# Patient Record
Sex: Female | Born: 1960 | Hispanic: No | Marital: Married | Smoking: Never smoker
Health system: Southern US, Community
[De-identification: ages and names within clinical notes are randomized; demographics above are authoritative.]

## PROBLEM LIST (undated history)

## (undated) HISTORY — PX: AUGMENTATION MAMMAPLASTY: SUR837

## (undated) HISTORY — PX: NOSE SURGERY: SHX723

---

## 2000-08-08 ENCOUNTER — Encounter: Payer: Self-pay | Admitting: Emergency Medicine

## 2000-08-08 ENCOUNTER — Emergency Department (HOSPITAL_COMMUNITY): Admission: EM | Admit: 2000-08-08 | Discharge: 2000-08-08 | Payer: Self-pay | Admitting: Emergency Medicine

## 2001-05-20 ENCOUNTER — Other Ambulatory Visit: Admission: RE | Admit: 2001-05-20 | Discharge: 2001-05-20 | Payer: Self-pay | Admitting: Gynecology

## 2002-05-27 ENCOUNTER — Encounter: Payer: Self-pay | Admitting: Internal Medicine

## 2002-05-27 ENCOUNTER — Encounter: Admission: RE | Admit: 2002-05-27 | Discharge: 2002-05-27 | Payer: Self-pay | Admitting: Internal Medicine

## 2010-04-28 ENCOUNTER — Encounter: Payer: Self-pay | Admitting: Unknown Physician Specialty

## 2012-02-03 ENCOUNTER — Ambulatory Visit: Payer: Self-pay | Admitting: Gynecology

## 2013-06-30 ENCOUNTER — Ambulatory Visit: Payer: Self-pay | Admitting: Gynecology

## 2013-07-05 ENCOUNTER — Encounter: Payer: Self-pay | Admitting: Gynecology

## 2013-07-05 ENCOUNTER — Other Ambulatory Visit (HOSPITAL_COMMUNITY)
Admission: RE | Admit: 2013-07-05 | Discharge: 2013-07-05 | Disposition: A | Discharge status: Home / Self Care | Payer: Self-pay | Source: Ambulatory Visit | Attending: Gynecology | Admitting: Gynecology

## 2013-07-05 ENCOUNTER — Ambulatory Visit (INDEPENDENT_AMBULATORY_CARE_PROVIDER_SITE_OTHER): Payer: Self-pay | Admitting: Gynecology

## 2013-07-05 VITALS — BP 112/76 | Ht 61.0 in | Wt 110.0 lb

## 2013-07-05 DIAGNOSIS — N644 Mastodynia: Secondary | ICD-10-CM

## 2013-07-05 DIAGNOSIS — Z01419 Encounter for gynecological examination (general) (routine) without abnormal findings: Secondary | ICD-10-CM | POA: Insufficient documentation

## 2013-07-05 DIAGNOSIS — Z1151 Encounter for screening for human papillomavirus (HPV): Secondary | ICD-10-CM | POA: Insufficient documentation

## 2013-07-05 DIAGNOSIS — Z1159 Encounter for screening for other viral diseases: Secondary | ICD-10-CM

## 2013-07-05 DIAGNOSIS — M25559 Pain in unspecified hip: Secondary | ICD-10-CM

## 2013-07-05 LAB — CBC WITH DIFFERENTIAL/PLATELET
BASOS ABS: 0 10*3/uL (ref 0.0–0.1)
Basophils Relative: 0 % (ref 0–1)
Eosinophils Absolute: 0.1 10*3/uL (ref 0.0–0.7)
Eosinophils Relative: 1 % (ref 0–5)
HEMATOCRIT: 40.9 % (ref 36.0–46.0)
Hemoglobin: 13.9 g/dL (ref 12.0–15.0)
Lymphocytes Relative: 23 % (ref 12–46)
Lymphs Abs: 1.6 10*3/uL (ref 0.7–4.0)
MCH: 31.9 pg (ref 26.0–34.0)
MCHC: 34 g/dL (ref 30.0–36.0)
MCV: 93.8 fL (ref 78.0–100.0)
Monocytes Absolute: 0.6 10*3/uL (ref 0.1–1.0)
Monocytes Relative: 8 % (ref 3–12)
Neutro Abs: 4.8 10*3/uL (ref 1.7–7.7)
Neutrophils Relative %: 68 % (ref 43–77)
Platelets: 246 10*3/uL (ref 150–400)
RBC: 4.36 MIL/uL (ref 3.87–5.11)
RDW: 13 % (ref 11.5–15.5)
WBC: 7.1 10*3/uL (ref 4.0–10.5)

## 2013-07-05 LAB — COMPREHENSIVE METABOLIC PANEL
ALBUMIN: 4.5 g/dL (ref 3.5–5.2)
ALT: 20 U/L (ref 0–35)
AST: 24 U/L (ref 0–37)
Alkaline Phosphatase: 63 U/L (ref 39–117)
BUN: 20 mg/dL (ref 6–23)
CALCIUM: 9.6 mg/dL (ref 8.4–10.5)
CHLORIDE: 103 meq/L (ref 96–112)
CO2: 27 mEq/L (ref 19–32)
Creat: 0.57 mg/dL (ref 0.50–1.10)
Glucose, Bld: 84 mg/dL (ref 70–99)
Potassium: 4 mEq/L (ref 3.5–5.3)
SODIUM: 138 meq/L (ref 135–145)
TOTAL PROTEIN: 6.8 g/dL (ref 6.0–8.3)
Total Bilirubin: 0.5 mg/dL (ref 0.2–1.2)

## 2013-07-05 LAB — CHOLESTEROL, TOTAL: Cholesterol: 196 mg/dL (ref 0–200)

## 2013-07-05 LAB — TSH: TSH: 1.446 u[IU]/mL (ref 0.350–4.500)

## 2013-07-05 LAB — HEPATITIS C ANTIBODY: HCV Ab: NEGATIVE

## 2013-07-05 NOTE — Patient Instructions (Addendum)
Vacuna difteria, tétanos, tos ferina (DTP) - Lo que debe saber   (Tetanus, Diphtheria, Pertussis [Tdap] Vaccine, What You Need to Know)  ¿PORQUÉ VACUNARSE?   El tétanos, la difteria y la tos ferina pueden ser enfermedades muy graves, aún en adolescentes y adultos. La vacuna Tdap nos puede proteger de estas enfermedades.   El TÉTANOS (Trismo) provoca la contracción dolorosa de los músculos, por lo general, en todo el cuerpo.   · Puede causar el endurecimiento de los músculos de la cabeza y el cuello, de modo que impide abrir la boca, tragar y en algunos casos, respirar. El tétanos causa la muerte de 1 de cada 5 personas que se infectan.  La DIFTERIA produce la formación de una membrana gruesa que cubre el fondo de la garganta.   · Puede causar problemas respiratorios, parálisis, insuficiencia cardíaca e incluso la muerte.  TOS FERINA (Pertusis) causa episodios de tos graves, que pueden hacer difícil la respiración, causar vómitos y trastornos del sueño.   · También puede ser la causa de pérdida de peso, incontinencia y fractura de costillas. Dos de cada 100 adolescentes y cinco de cada 100 adultos que enferman de pertusis deben ser hospitalizados, tienen complicaciones como la neumonía o mueren.  Estas enfermedades son provocadas por bacterias. La difteria y el pertusis se contagian de persona a persona a través de la tos o el estornudo. El tétanos ingresa al organismo a través de cortes, rasguños o heridas.   Antes de las vacunas, en los [place] se vieron más de 200.000 casos al año de difteria y tos ferina y cientos de casos de tétanos. Desde el inicio de la vacunación, los casos de tétanos y difteria han disminuido alrededor del 99% y los casos de tos ferina alrededor del 80%.   Tdap   La vacuna Tdap protege a adolescentes y adultos contra el tétanos, la difteria y la tos ferina. Una dosis de Tdap se administra a los 11 o 12 años de edad. Las personas que no recibieron la vacuna Tdap a esa edad deben  recibirla tan pronto como sea posible.   Es muy importante que los profesionales de la salud y todos aquellos que tengan contacto cercano con bebés menores de 12 meses reciban la Tdap.   Las mujeres embarazadas deben recibir una dosis de Tdap en cada embarazo, para proteger al recién nacido de la tos ferina. Los niños tienen mayor riesgo de complicaciones graves y potencialmente mortales debido a la tos ferina.   Una vacuna similar, llamada Td, protege contra el tétanos y la difteria, pero no contra la tos ferina. Cada 10 años debe recibirse un refuerzo de Td. La Tdap se puede administrar como uno de estos refuerzos, si todavía no ha recibido una dosis. También se puede aplicar después de un corte o quemadura grave para prevenir la infección por tétanos.   El médico le dará más información.   La Tdap puede administrarse de manera segura simultáneamente con otras vacunas.   ALGUNAS PERSONAS NO DEBEN RECIBIR ESTA VACUNA.   · Si alguna vez tuvo una reacción alérgica potencialmente mortal después de una dosis de la vacuna contra el tétanos, la diferia o la tos ferina, o tuvo una alergia grave a cualquiera de los componentes de esta vacuna, no debe aplicarse la vacuna. Informe a su médico si usted sufre algún tipo de alergia grave.  · Si estuvo en coma o sufrió múltiples convulsiones dentro de los 7 días posteriores después de una dosis de DTP o DTaP   su mdico si:  tiene epilepsia u otra enfermedad del sistema nervioso,  siente dolor intenso o se hincha despus de recibir cualquier vacuna contra la difteria, el ttanos o la tos ferina,  alguna vez ha sufrido el sndrome de Guillain-Barr,  no se siente bien el da en que se ha programado la vacuna. RIESGOS DE UNA REACCIN A LA VACUNA Con cualquier medicamento, incluyendo las vacunas, existe la posibilidad de que aparezcan efectos secundarios. Estos son  leves y desaparecen por s solos, pero tambin son posibles las reacciones graves.  Breves episodios de desmayo pueden seguir a una vacunacin, causando lesiones por la cada. Sentarse o recostarse durante 15 minutos puede ayudar a evitarlo. Informe al mdico si se siente mareado o aturdido, tiene cambios en la visin o zumbidos en los odos.  Problemas leves luego de la Tdap (no interferirn con las actividades)   Dolor en el sitio de la inyeccin (alrededor de 1 de cada 4 adolescentes o 2 de cada 3 adultos).  Enrojecimiento o hinchazn en el lugar de la inyeccin (1 de cada 5 personas).  Fiebre leve de al menos 100,4 F (38 C) (hasta alrededor de 1 cada 25 adolescentes y 1 de cada 100 adultos).  Dolor de cabeza (3 o 4de cada 10 personas).  Cansancio (1 de cada 3 o 4 personas).  Nuseas, vmitos, diarrea, dolor de estmago (1 de cada 4 adolescentes o 1 de cada 10 adultos).  Escalofros, dolores corporales, dolor articular, erupciones, inflamacin de las glndulas (poco frecuente). Problemas moderados: (interfieren con las actividades, pero no requieren atencin mdica)   Dolor en el lugar de la inyeccin (1 de cada 5 adolescentes o 1 de cada 100 adultos).  Enrojecimiento o inflamacin (1 de cada 16 adolescentes y 1 de cada 25 adultos).  Fiebre de ms de 102F o 38,9C (1 de cada 100 adolescentes o 1 de cada 250 adultos).  Dolor de cabeza (alrededor de 4 de cada 20 adolescentes y 3 de cada 10 adultos).  Nuseas, vmitos, diarrea, dolor de estmago (1 a 3 de cada 100 personas).  Hinchazn de todo el brazo en el que se aplic la vacuna (3 de cada100 personas). Problemas graves: luego de la Tdap (no puede realizar las actividades habituales, requiere atencin mdica)   Inflamacin, dolor intenso, sangrado y enrojecimiento en el brazo, en el sitio de la inyeccin (poco frecuente). Una reaccin alrgica grave puede ocurrir despus de la administracin de cualquier vacuna (se estima en  menos de 1 en un milln de dosis).  QU PASA SI HAY UNA REACCIN GRAVE?  Qu signos debo buscar?  Observe todo lo que le preocupe, como signos de una reaccin alrgica grave, fiebre muy alta o cambios en el comportamiento. Los signos de una reaccin alrgica grave pueden incluir urticaria, hinchazn de la cara y la garganta, dificultad para respirar, ritmo cardaco acelerado, mareos y debilidad. Estos sntomas pueden comenzar entre unos pocos minutos y algunas horas despus de la vacunacin.  Qu debo hacer?  Si usted piensa que se trata de una reaccin alrgica grave o de otra emergencia que no puede esperar, llame al 911 o lleve a la persona al hospital ms cercano. De lo contrario, llame a su mdico.  Despus, la reaccin debe informarse a la "Vaccine Adverse Event Reporting System" (Sistema de informacin sobre efectos adversos de las vacunas -VAERS). El mdico o usted mismo pueden realizar el informe en el sitio web del VAERS www.vaers.hhs.govo llame al 1-800-822-7967. El VAERS es slo para informar reacciones. No   brindan consejo mdico.  PROGRAMA NACIONAL DE COMPENSACIN DE DAOS POR VACUNAS  El National Vaccine Injury [employer] (VICP) es un programa federal que fue creado para compensar a las personas que puedan haber sufrido daos al recibir ciertas vacunas.  Aquellas personas que consideren que han sufrido un dao como consecuencia de una vacuna y quieren saber ms acerca del programa y como presentar Roslynn Amble, pueden llamar [phone] o visite el sitio web del VICP en SpiritualWord.[email or web address].  CMO PUEDO OBTENER MS INFORMACIN?   Consulte a su mdico.  Comunquese con el servicio de salud de su localidad o [address].  Comunquese con los Centros para el control y la prevencin de Child psychotherapist for Disease Control and Prevention , CDC).  llamando al [phone] o visitando el sitio web del CDC en PicCapture.uy. CDC Tdap Vaccine VIS  (08/14/11)  Document Released: 03/10/2012 [facility] Patient Information 2014 [place], [place]. Densitometra sea  (Bone Densitometry) La densitometra sea es una radiografa especial que mide la densidad de los huesos y se [place] para predecir el riesgo de fracturas seas. Esta estudio se [place] para Warehouse manager contenido mineral y la densidad de los huesos para diagnosticar osteoporosis. La osteoporosis es la prdida de tejido seo que hace que el hueso se debilite. Generalmente ocurre en las mujeres que entran en la menopausia. Pero tambin pueden sufrirla los hombres y personas con otras enfermedades.  PREPARACIN PARA LA PRUEBA  No es necesaria la preparacin.  Geronimo Running EXAMINARSE?  Todas las [employer] de 65 aos. Las mujeres posmenopusicas (50 a 65 aos) con factores de riesgo para osteoporosis. Las personas que han sufrido fracturas previas realizando actividades normales. Las personas de contextura corporal delgada (menos de 127 libras [63.5 kg] o con un ndice de masa corporal [IMC] de menos de 21). Las personas que tengan un padre que haya sufrido una fractura de cadera o que tengan antecedentes de osteoporosis. Los fumadores. Las personas que sufren artritis reumatoidea. Los que consumen alcohol en exceso (ms de 3 medidas la mayor parte de [place]). Las mujeres con menopausia temprana. CUNDO DEBE REALIZAR UN NUEVO ESTUDIO?  Las guas actuales sugieren que se debe esperar por lo menos 2 aos antes de repetir una prueba de densidad sea, si la primera fue normal. Algunos estudios recientes indican que las mujeres con densidad sea normal pueden esperar unos aos antes de repetir un estudio de densitometra sea. Comente estos temas con su mdico.  HALLAZGOS NORMALES:  Normal: menos de una desviacin estndar por debajo de lo normal (superior a -1). Osteopenia:  1 a 2,5 desviaciones estndar por debajo de lo normal (-1 a -2,5). Osteoporosis: ms de 2,5 desviaciones  estndar por debajo de lo normal (menos de -2,5). Los [employer] se informan como una "puntuacin T" y Neomia Dear "puntuacin Z". La puntuacin T es el nmero que compara la densidad sea con la densidad sea de las mujeres jvenes y sanas. La puntuacin Z es un nmero que compara la densidad sea con las puntuaciones de mujeres de la misma [place], gnero y Armed forces training and education officer.  Los rangos para los resultados normales pueden variar entre diferentes laboratorios y hospitales. Consulte siempre con su mdico despus de Production assistant, radio estudio para Metallurgist significado de los [place] y si los valores se consideran "dentro de los lmites normales".  SIGNIFICADO DEL ESTUDIO  El mdico leer los resultados y Agricultural engineer con usted la importancia y el significado de los [place], as como las opciones de tratamiento y la necesidad de pruebas adicionales, si fuera  necesario.  OBTENCIN DE LOS RESULTADOS DE LAS PRUEBAS  Es su responsabilidad retirar el resultado del [place]udio. Consulte en el laboratorio cuando y cmo podr Starbucks Corporationobtener los resultados.  Document Released: 12/17/2011 [facility] Patient Information 2014 [place], [place].

## 2013-07-05 NOTE — Progress Notes (Signed)
Melanie Collins 12-07-1960 [id number]   History:    53 y.o.  for annual gyn exam who is a new patient to the practice. Patient for several months has complained of slight tenderness on the lateral aspect of the right breast but has not palpate any masses per se. She denies any family history of breast cancer. She had a [place] in 2014 [place] which she states was normal. Patient does have history of having saline implants placed 4 years ago on both breasts. She also complains of times and nonspecific right hip pain but denies any radiculopathy. She stated that she reached menopause 2 years ago and is having no vasomotor symptoms and was never on any HRT.  Patient states that many years ago in her native country she had some form of cervical dysplasia and procedure but she does not recall but denied that it was any form of cervical cancer. But that her Pap smears after that had been normal.  Past medical history,surgical history, family history and social history were all reviewed and documented in the EPIC chart.  Gynecologic History Patient's last menstrual period was 07/06/2011. Contraception: post menopausal status Last Pap: 3 years ago. Results were: It was done in another city and patient reports that it was normal Last mammogram: 2014. Results were: Reported to be normal by patient done in [place]  Obstetric History OB History  Gravida Para Term Preterm AB SAB TAB Ectopic Multiple Living  3 3        3     # Outcome Date GA Lbr Len/2nd Weight Sex Delivery Anes PTL Lv  3 PAR           2 PAR           1 PAR                ROS: A ROS was performed and pertinent positives and negatives are included in the history.  GENERAL: No fevers or chills. HEENT: No change in vision, no earache, sore throat or sinus congestion. NECK: No pain or stiffness. CARDIOVASCULAR: No chest pain or pressure. No palpitations. PULMONARY: No shortness of breath, cough or  wheeze. GASTROINTESTINAL: No abdominal pain, nausea, vomiting or diarrhea, melena or bright red blood per rectum. GENITOURINARY: No urinary frequency, urgency, hesitancy or dysuria. MUSCULOSKELETAL: No joint or muscle pain, no back pain, no recent trauma. DERMATOLOGIC: No rash, no itching, no lesions. ENDOCRINE: No polyuria, polydipsia, no heat or cold intolerance. No recent change in weight. HEMATOLOGICAL: No anemia or easy bruising or bleeding. NEUROLOGIC: No headache, seizures, numbness, tingling or weakness. PSYCHIATRIC: No depression, no loss of interest in normal activity or change in sleep pattern.     Exam: chaperone present  BP 112/76  Ht 5\' 1"  (1.549 m)  Wt 110 lb (49.896 kg)  BMI 20.80 kg/m2  LMP 07/06/2011  Body mass index is 20.8 kg/(m^2).  General appearance : Well developed well nourished female. No acute distress HEENT: Neck supple, trachea midline, no carotid bruits, no thyroidmegaly Lungs: Clear to auscultation, no rhonchi or wheezes, or rib retractions  Heart: Regular rate and rhythm, no murmurs or gallops Breast:Examined in sitting and supine position were symmetrical in appearance, no palpable masses or tenderness,  no skin retraction, no nipple inversion, no nipple discharge, no skin discoloration, no axillary or supraclavicular lymphadenopathy Abdomen: no palpable masses or tenderness, no rebound or guarding Extremities: no edema or skin discoloration or tenderness  Pelvic:  [place],  Urethra, Skene Glands: Within normal limits             Vagina: No gross lesions or discharge  Cervix: No gross lesions or discharge  Uterus  anteverted, normal size, shape and consistency, non-tender and mobile  Adnexa  Without masses or tenderness  Anus and perineum  normal   Rectovaginal  normal sphincter tone without palpated masses or tenderness             Hemoccult cards will be provided     Assessment/Plan:  53 y.o. female for annual exam who has not had a gynecological  exam in 3 years. Patient with bilateral saline implants placed 4 years ago. Mild mastodynia, we discussed minimizing caffeine intake. She will be scheduled for a screening mammogram. We discussed importance of monthly breast exam. We discussed importance of calcium and vitamin D in regular exercise for osteoporosis prevention. The following labs were ordered: CBC, screen cholesterol, comprehensive metabolic panel, TSH, urinalysis as well as Pap smear.  New CDC guidelines is recommending patients be tested once in her lifetime for hepatitis C antibody who were born between [id number] through 1963. This was discussed with the patient today and has agreed to be tested today.  Patient will be recommended to undergo bone density study to better look at her spine and hip. Information was provided in [place].  Note: This dictation was prepared with  Dragon/digital dictation along withSmart phrase technology. Any transcriptional errors that result from this process are unintentional.   Ok EdwardsFERNANDEZ,Neena Beecham H MD, 9:48 AM 07/05/2013

## 2013-07-06 LAB — URINALYSIS W MICROSCOPIC + REFLEX CULTURE
BILIRUBIN URINE: NEGATIVE
Bacteria, UA: NONE SEEN
CASTS: NONE SEEN
Crystals: NONE SEEN
GLUCOSE, UA: NEGATIVE mg/dL
HGB URINE DIPSTICK: NEGATIVE
KETONES UR: NEGATIVE mg/dL
Leukocytes, UA: NEGATIVE
Nitrite: NEGATIVE
Protein, ur: NEGATIVE mg/dL
Specific Gravity, Urine: 1.026 (ref 1.005–1.030)
Squamous Epithelial / LPF: NONE SEEN
Urobilinogen, UA: 0.2 mg/dL (ref 0.0–1.0)
pH: 6 (ref 5.0–8.0)

## 2014-01-18 ENCOUNTER — Other Ambulatory Visit: Payer: Self-pay

## 2014-01-18 ENCOUNTER — Other Ambulatory Visit: Payer: Self-pay | Admitting: Gynecology

## 2014-01-20 ENCOUNTER — Other Ambulatory Visit: Payer: Self-pay

## 2014-01-20 DIAGNOSIS — Z1231 Encounter for screening mammogram for malignant neoplasm of breast: Secondary | ICD-10-CM

## 2014-02-06 ENCOUNTER — Encounter: Payer: Self-pay | Admitting: Gynecology

## 2014-02-09 ENCOUNTER — Ambulatory Visit
Admission: RE | Admit: 2014-02-09 | Discharge: 2014-02-09 | Disposition: A | Discharge status: Home / Self Care | Payer: No Typology Code available for payment source | Source: Ambulatory Visit

## 2014-02-09 DIAGNOSIS — Z1231 Encounter for screening mammogram for malignant neoplasm of breast: Secondary | ICD-10-CM

## 2016-08-20 ENCOUNTER — Encounter: Payer: Self-pay | Admitting: Gynecology

## 2017-03-17 ENCOUNTER — Ambulatory Visit (INDEPENDENT_AMBULATORY_CARE_PROVIDER_SITE_OTHER): Payer: Self-pay | Admitting: Obstetrics & Gynecology

## 2017-03-17 ENCOUNTER — Encounter: Payer: Self-pay | Admitting: Obstetrics & Gynecology

## 2017-03-17 VITALS — BP 120/78 | Ht 61.0 in | Wt 113.0 lb

## 2017-03-17 DIAGNOSIS — Z01411 Encounter for gynecological examination (general) (routine) with abnormal findings: Secondary | ICD-10-CM

## 2017-03-17 DIAGNOSIS — N644 Mastodynia: Secondary | ICD-10-CM

## 2017-03-17 DIAGNOSIS — Z87440 Personal history of urinary (tract) infections: Secondary | ICD-10-CM

## 2017-03-17 NOTE — Patient Instructions (Signed)
1. Breast pain in female Intermittent right breast pain for many years associated with movement and pressure.  No breast mass felt.  Will proceed with Rt Dx Mammo/Breast US and Lt Screening Mammo.  2. Encounter for gynecological examination with abnormal finding Normal gynecologic exam except for breast exam.  Pap reflex normal 11/2016, but per patient a complete exam was not done.  3. History of acute cystitis Treated in 11/2016, will do test of cure today. - Urinalysis with Culture Reflex  Peter Congo, fue un placer encontrarla hoy!  Voy a informarla de Financial trader.   Mantenimiento de la salud de las mujeres posmenopusicas (Health Maintenance for Postmenopausal Women) La menopausia es un proceso normal en el cual se pierde la capacidad reproductiva. Este proceso ocurre gradualmente a lo largo de un perodo de meses o aos, por lo general entre los 43 y los 55aos. La menopausia es completa cuando no se han tenido 3mnstruaciones consecutivas. Es importante hablar con el mdico sobre algunas de las enfermedades ms comunes que afectan a las mujeres posmenopusicas, como la cardiopata coronaria, el cncer y la prdida de la masa sea (osteoporosis). Adoptar un estilo de vida saludable y recibir atencin preventiva pueden ayudar a promover la salud y eMusician Adems, estas medidas pueden reducir las probabilidades de desarrollar algunas de estas enfermedades frecuentes. QU DEBO SABER ACERCA DE LCarrollton Durante le mBonne Terre puede tener una serie de sntomas, por ejemplo:  Calores repentinos moderados a graves.  Sudoracin nocturna.  Disminucin del deseo sexual.  Cambios en el estado de nimo.  Dolores de cNetherlands  Cansancio.  Irritabilidad.  Problemas de memoria.  Insomnio. Tratar o no los cambios que ocurren en la menopausia es una decisin personal que se toma con el mdico. QU DEBO SABER SOBRE LOS TRATAMIENTOS DE REPOSICIN HORMONAL Y LOS  SUPLEMENTOS? Los productos para la terapia hormonal son eficaces para tratar los sntomas que se asocian con la menopausia, como los calores repentinos y las sudoraciones nocturnas. La reposicin hormonal conlleva ciertos riesgos, especialmente a medida que una mujer envejece. Si est pensando en usar tratamientos con estrgeno o estrgeno con progesterona, analice los beneficios y los riesgos con el mdico. QU DEBO SABER SClarksvilleLPerrysville A medida que una persona envejece, aumenta la probabilidad de tener cardiopata coronaria, infarto de miocardio e ictus. Esto puede deberse, en parte, a los cambios hormonales que atraviesa el cuerpo durante la menopausia. Estos cambios pueden afectar la forma en que el organismo procesa las gMcCaysville los triglicridos y el colesterol de su dieta. El infarto de miocardio y el ictus son emergencias mdicas. Hay muchas cosas que se pueden hacer para ayudar a prevenir la cardiopata coronaria y el ictus:  Debe controlar su presin arterial al menos cada uno o dWaggaman La hipertensin arterial causa enfermedades cardacas y aSerbiael riesgo de ictus.  Si tiene entre 587y 79aos, consulte al mdico si debe tomar aspirina para prevenir un infarto de miocardio o un ictus.  No consuma ningn producto que contenga tabaco, lo que incluye cigarrillos, tabaco de mHigher education careers advisero cPsychologist, sport and exercise Si necesita ayuda para dejar de fumar, consulte al mMeadWestvaco  Es importante seguir una dieta sana y mTheatre managerun peso saludable. ? Asegrese de iFamily Dollar Storesverduras, frutas, productos lcteos de bajo contenido de gDjiboutiy pAdvertising account planner ? No consuma alimentos con alto contenido de grasas slidas, azcares agregados o sal (sodio).  Realice actividad fsica con regularidad. Esta es una de las cosas ms iGenworth Financial  que puede hacer por su salud. ? Intente realizar al menos 122mnutos de actividad fsica por semana. El tipo de ejercicio que realice debe  aumentar la frecuencia cardaca y hacerla sudar. Esto se conoce como ejercicio de iMalta ? Intente hacer ejercicios de elongacin por lo menos dos veces por semana. Agrguelos al plan de ejercicio de intensidad moderada.  Conozca sus cifras. Pdale al mdico que le controle el colesterol y el nivel sanguneo de glucosa. Siga hacindose anlisis de sAmerican Electric Powerse lo haya indicado el mdico. QU DEBO SABER SOBRE LAS PRUEBAS DE DETECCIN DEL CNCER? Hay varios tipos de cncer. Tome las siguientes medidas para reducir el riesgo y dActuaryformacin cancerosa lo antes posible. Cncer de mama  Practique la autoconciencia de la mama. ? Esto significa reconocer la apariencia normal de sus mamas y cmo las siente. ? Tambin significa realizar autoexmenes regulares de las mIrrigon Informe a su mdico sobre cualquier cambio, sin importar cun pequeo sea.  Si es mayor de 40aos, visite a un mdico para qPublic librarianun examen de las mamas (exploracin clnica mamaria o ECM) todos los aRoyal En funcin de sToysRus los antecedentes familiares y la historia cRamsay tal vez sea recomendable que tambin se haga una radiografa anual de las mamas ([place].  Si tiene antecedentes familiares de cncer de mama, hable con el mdico para someterse a un estudio gentico.  Si tiene alto riesgo de pChief Financial Officerde mama, hable con el mdico para hacerse a uPublic house manager(RM) y uLavinia Sharpstodos los aNaplate  La evaluacin del gen del cncer de mama (BRCA) se recomienda a las mujeres que tengan familiares con tumores malignos relacionados con el BRCA. Los resultados de la evaluacin determinarn la necesidad de recibir asesoramiento gentico y pBuilding services engineerde deteccin del BRCA1 y el BRCA2. Los tumores malignos relacionados con el BRCA incluyen estos tipos: ? Mama. Este tipo se presenta en hombres o mujeres. ? Ovario. ? Trompas. A este tipo tambin se lo llama cncer de trompa de  [place]. ? Cncer de la pared abdominal o plvica (cncer de peritoneo). ? Prstata. ? Pncreas. Cncer de cuello uterino, de tero y de ovario El mdico puede recomendarle que se haga pruebas peridicas de deteccin de cncer de los rganos de la pelvis, los cuales iVerizonovarios, el tero y la vagina. Estas pruebas incluyen un examen plvico, que abarca controlar si se produjeron cambios microscpicos en la superficie del cuello del tero (prueba de Papanicolaou).  A las mujeres que tCircuit City21 y 653aos los mdicos pueden recomendarles que se realicen un examen plvico y uArdelia Memsprueba de Papanicolaou cada tres aos. A las mujeres que tienen entre 30 y 65aos, pueden recomendarles la prueba de Papanicolaou y el examen plvico, en combinacin con una prueba de deteccin del virus del papiloma humano (VPH) cPotosi Algunos tipos de VPH aumentan el riesgo de pChief Financial Officerde cuello del tero. La prueba para la deteccin del VPH tambin puede realizarse a mujeres de cualquier edad cuyos resultados de la prueba de Papanicolaou no sean claros.  Es posible que otros mdicos no recomienden exmenes de deteccin a las mujeres no embarazadas que se consideran sujetos de bajo riesgo de pChief Financial Officerde pelvis y no tienen sntomas. Pregntele al mdico si un examen plvico de deteccin es adecuado para usted.  Si ha recibido un tratamiento para eScience writercervical o una enfermedad que podra causar cncer, necesitar realizarse una prueba de Papanicolaou y controles durante  al menos 20 aos de concluido el [place]. Si no se ha hecho el Papanicolaou con regularidad, debern volver a evaluarse los factores de riesgo (como tener un nuevo compaero sexual), para Teacher, adult education si debe empezar a Dispensing optician los estudios nuevamente. Algunas mujeres sufren problemas mdicos que aumentan la probabilidad de Museum/gallery curator cncer de cuello del tero. En estos casos, el mdico podr [employer] se realicen  controles y pruebas de Papanicolaou con ms frecuencia.  Si tiene antecedentes familiares de cncer de tero o de ovario, hable con el mdico para someterse a un estudio gentico.  Si tiene hemorragia vaginal despus de la menopausia, informe al mdico.  En la actualidad, no hay pruebas confiables para la deteccin del cncer de ovario. Cncer de pulmn Se recomienda realizar exmenes de deteccin de cncer de pulmn a personas adultas entre 68 y 42 aos que estn en riesgo de Horticulturist, commercial de pulmn por sus antecedentes de consumo de tabaco. Se recomienda una tomografa computarizada (TC) de baja dosis de los pulmones todos los aos si usted:  Fuma actualmente.  Ha fumado durante 30aos un paquete diario y sigue fumando o dej el hbito en algn momento en los ltimos 15aos. Un paquete-ao equivale a fumar en promedio un paquete de cigarrillos diario durante un ao. Los exmenes de deteccin anuales:  Deben hacerse hasta que hayan pasado 15aos desde que dej de fumar.  Deben dejar de realizarse si tiene un problema de salud que le impide recibir tratamiento para el cncer de pulmn. Cncer colorrectal  Este tipo de cncer puede detectarse y a menudo prevenirse.  El estudio de [place] de Programme researcher, broadcasting/film/video del cncer colorrectal debe comenzar a Electrical engineer a Proofreader de los 16aos y [place].  El mdico puede aconsejarle que lo haga antes, si tiene factores de riesgo de Best boy cncer de colon.  Si tiene antecedentes familiares de cncer colorrectal, hable con el mdico para someterse a un estudio gentico.  El mdico tambin puede recomendarle que use un kit de prueba para Engineer, mining a fin de Educational psychologist en la materia fecal.  Es posible que se use una pequea cmara en el extremo de un tubo para examinar directamente el colon (sigmoidoscopia o colonoscopia) a fin de Hydrographic surveyor formas tempranas de cncer colorrectal.  El examen directo del colon se debe repetir cada 5 a  10aos hasta los 73aos. Sin embargo, si se hallan formas incipientes de plipos precancerosos o pequeos tumores, o si tiene antecedentes familiares o riesgo gentico de Therapist, music, debe realizarse exmenes de deteccin con ms frecuencia. Cncer de piel  Revise la piel de la cabeza a los pies con regularidad.  Contrlese los lunares. Infrmele al mdico: ? Si aparecen nuevos lunares o los que tiene se modifican, especialmente en su forma o color. ? Si tiene un lunar que es ms grande que el tamao de una goma de Games developer.  Si alguno de los miembros de su familia tiene antecedentes de cncer de piel, especialmente a una edad temprana, hable con el mdico para someterse a pruebas genticas.  Siempre use pantalla solar. Aplique pantalla solar de Kerry Dory y repetida a lo largo del Training and development officer.  Protjase usando mangas y [employer], un sombrero de ala ancha y gafas para el sol, siempre que est al [place]. QU DEBO SABER SOBRE LA OSTEOPOROSIS? La osteoporosis es una afeccin en la cual la destruccin de la masa sea ocurre con mayor rapidez que su formacin. Despus de la menopausia, puede correr un riesgo  ms alto de tener osteoporosis. Para ayudar a prevenir esta afeccin o las fracturas seas que pueden ocurrir a causa de [place], se recomienda lo siguiente:  Si tiene entre 19 y 50aos, tome como mnimo 1058m de calcio y 608mde vitaminaD por daTraining and development officer Si es mayor de 50aos pero menor de 70aos, tome como mnimo 120031me calcio y 600m21m vitaminaD por da. Training and development officeri es mayor de 70aos, tome como mnimo 1200mg59mcalcio y 800mg 63mitaminaD por da. ElTraining and development officerabaquismo y el consumo excesivo de alcohol aumentan el riesgo de osteoporosis. Consuma alimentos ricos en calcio y vitaminaD, y haga ejercicios con soporte de peso varias veces a la semana, como se lo haya indicado el mdico. QU DEBO SABER SOBRE EL MODO EN QUE LA MENOPAUSIA AFECTA MI SALUD MENTAL? La depresin puede  presentarse a cualquier edad, pero es ms frecuente a medida que una persona envejece. Los sntomas comunes de depresin incluyen lo siguiente:  Desnimo o tristeza.  Cambios en los patrones de sueo.  Cambios en el apetito o en los hbitos de alimentacin.  Sensacin de falta general de motivacin o placer al realizYahooidades que sola disfrutar.  Crisis frecuentes de llanto. Hable con el mdico si cree que tiene depresin. QU DEBO SABER SOBRE LAS VACUNAS? Es importante que se aplique las vacunas y las maBrooklands incluyen las siguientes:  Vacuna contra el ttanos, la difteria y la tosferina (Tdap).  Vacuna anual contra la gripe antes del inicio de la temporada de gripe.  Vacuna contra la neumona.  Vacuna contra el herpes. El mdico tambin puede recomendarle que se aplique otras vacunas. Esta informacin no tiene como fMarine scientistnsejo del mdico. Asegrese de hacerle al mdico cualquier pregunta que tenga. Document Released: 01/12/2013 Document Revised: 04/14/2014 Document Reviewed: 12/26/2014 Elsevier Interactive Patient Education  2018 [employer]

## 2017-03-17 NOTE — Progress Notes (Signed)
Melanie AuerbachGloria Collins 11/29/1960 [id number]   History:    56 y.o. G3P3 Married.  RP:  Intermittent right breast pain for many years  HPI: Menopausal without hormone replacement therapy since 2013.  No postmenopausal bleeding.  Patient complains of intermittent right breast pain for many years.  The pain is at the upper outer aspect of the right breast and it is increased by movement as well as pressure on the breast.  She was complaining of the same symptoms at the time of her visit with Dr. Lily PeerFernandez in March 2015.  She had bilateral saline breast implants put in place in 2011.  No lump felt.  No nipple discharge.  No change in skin over the breast.  No family history of breast cancer.  Patient's last mammogram was in November 2015 and was negative at that time.  No pelvic pain.  Urine and bowel movements normal.  Past medical history,surgical history, family history and social history were all reviewed and documented in the EPIC chart.  Gynecologic History Patient's last menstrual period was 07/06/2011. Contraception: post menopausal status Last Pap: 06/2013. Results were: Negative Last mammogram: 02/2014. Results were: Negative No previous colonoscopy  Obstetric History OB History  Gravida Para Term Preterm AB Living  3 3       3   SAB TAB Ectopic Multiple Live Births               # Outcome Date GA Lbr Len/2nd Weight Sex Delivery Anes PTL Lv  3 Para           2 Para           1 Para                ROS: A ROS was performed and pertinent positives and negatives are included in the history.  GENERAL: No fevers or chills. HEENT: No change in vision, no earache, sore throat or sinus congestion. NECK: No pain or stiffness. CARDIOVASCULAR: No chest pain or pressure. No palpitations. PULMONARY: No shortness of breath, cough or wheeze. GASTROINTESTINAL: No abdominal pain, nausea, vomiting or diarrhea, melena or bright red blood per rectum. GENITOURINARY: No urinary frequency, urgency, hesitancy  or dysuria. MUSCULOSKELETAL: No joint or muscle pain, no back pain, no recent trauma. DERMATOLOGIC: No rash, no itching, no lesions. ENDOCRINE: No polyuria, polydipsia, no heat or cold intolerance. No recent change in weight. HEMATOLOGICAL: No anemia or easy bruising or bleeding. NEUROLOGIC: No headache, seizures, numbness, tingling or weakness. PSYCHIATRIC: No depression, no loss of interest in normal activity or change in sleep pattern.     Exam:   BP 120/78   Ht 5\' 1"  (1.549 m)   Wt 113 lb (51.3 kg)   LMP 07/06/2011   BMI 21.35 kg/m   Body mass index is 21.35 kg/m.  General appearance : Well developed well nourished female. No acute distress HEENT: Eyes: no retinal hemorrhage or exudates,  Neck supple, trachea midline, no carotid bruits, no thyroidmegaly Lungs: Clear to auscultation, no rhonchi or wheezes, or rib retractions  Heart: Regular rate and rhythm, no murmurs or gallops Breast:Examined in sitting and supine position were symmetrical with bilateral breast implants, no palpable masses,  no skin retraction, no nipple inversion, no nipple discharge, no skin discoloration, no axillary or supraclavicular lymphadenopathy.  Tender right breast at the upper outer aspect.  Tenderness is very deep, could be intercostal. Abdomen: no palpable masses or tenderness, no rebound or guarding Extremities: no edema or skin discoloration or tenderness  Pelvic: Vulva normal  Bartholin, Urethra, Skene Glands: Within normal limits             Vagina: No gross lesions or discharge  Cervix: No gross lesions or discharge  Uterus  AV, normal size, shape and consistency, non-tender and mobile  Adnexa  Without masses or tenderness  Anus and perineum  normal   Assessment/Plan:  56 y.o. female for breast pain  1. Breast pain in female Intermittent right breast pain for many years associated with movement and pressure.  No breast mass felt.  Will proceed with Rt Dx Mammo/Breast US and Lt Screening  Mammo.  2. Encounter for gynecological examination with abnormal finding Normal gynecologic exam except for breast exam.  Pap reflex normal 11/2016, but per patient a complete exam was not done.  3. History of acute cystitis Treated in 11/2016, will do test of cure today. - Urinalysis with Culture Reflex  Counseling on above issue >50% x 45 minutes  Genia DelMarie-Lyne Chantelle Verdi MD, 3:49 PM 03/17/2017

## 2017-03-18 ENCOUNTER — Telehealth: Payer: Self-pay | Admitting: *Deleted

## 2017-03-18 DIAGNOSIS — N644 Mastodynia: Secondary | ICD-10-CM

## 2017-03-18 LAB — URINALYSIS W MICROSCOPIC + REFLEX CULTURE
Bacteria, UA: NONE SEEN /HPF
Bilirubin Urine: NEGATIVE
Glucose, UA: NEGATIVE
Hyaline Cast: NONE SEEN /LPF
Ketones, ur: NEGATIVE
Leukocyte Esterase: NEGATIVE
Nitrites, Initial: NEGATIVE
Protein, ur: NEGATIVE
RBC / HPF: NONE SEEN /HPF (ref 0–2)
Specific Gravity, Urine: 1.025 (ref 1.001–1.03)
Squamous Epithelial / LPF: NONE SEEN /HPF (ref <=5)
WBC, UA: NONE SEEN /HPF (ref 0–5)
pH: 5.5 (ref 5.0–8.0)

## 2017-03-18 LAB — NO CULTURE INDICATED

## 2017-03-18 NOTE — Telephone Encounter (Signed)
Patient doesn't have insurance I called Melanie Collins [phone] to help with uninsured patient. I left message for her to call me to schedule.

## 2017-03-18 NOTE — Telephone Encounter (Signed)
-----   Message from Genia DelMarie-Lyne Lavoie, MD sent at 03/17/2017  3:53 PM EST ----- Regarding: Rt breast pain outer upper quadrant Right Dx Mammo/Breast US and Left Screening Mammo.

## 2017-04-08 ENCOUNTER — Telehealth (HOSPITAL_COMMUNITY): Payer: Self-pay | Admitting: *Deleted

## 2017-04-08 NOTE — Telephone Encounter (Signed)
Telephoned patient and left message to return call to [facility] 

## 2017-04-08 NOTE — Telephone Encounter (Signed)
Late entry) I called Martie LeeSabrina [phone] again and she called patient and mailed new patient information due to self pay.

## 2017-04-10 ENCOUNTER — Other Ambulatory Visit (HOSPITAL_COMMUNITY): Payer: Self-pay | Admitting: *Deleted

## 2017-04-10 DIAGNOSIS — N644 Mastodynia: Secondary | ICD-10-CM

## 2017-04-13 NOTE — Telephone Encounter (Signed)
Pt scheduled on 04/30/17

## 2017-04-30 ENCOUNTER — Ambulatory Visit (HOSPITAL_COMMUNITY)
Admission: RE | Admit: 2017-04-30 | Discharge: 2017-04-30 | Disposition: A | Discharge status: Home / Self Care | Payer: Self-pay | Source: Ambulatory Visit | Attending: Obstetrics and Gynecology | Admitting: Obstetrics and Gynecology

## 2017-04-30 ENCOUNTER — Other Ambulatory Visit (HOSPITAL_COMMUNITY): Payer: Self-pay | Admitting: Obstetrics and Gynecology

## 2017-04-30 ENCOUNTER — Ambulatory Visit
Admission: RE | Admit: 2017-04-30 | Discharge: 2017-04-30 | Disposition: A | Discharge status: Home / Self Care | Payer: No Typology Code available for payment source | Source: Ambulatory Visit | Attending: Obstetrics and Gynecology | Admitting: Obstetrics and Gynecology

## 2017-04-30 ENCOUNTER — Encounter (HOSPITAL_COMMUNITY): Payer: Self-pay

## 2017-04-30 ENCOUNTER — Other Ambulatory Visit (HOSPITAL_COMMUNITY): Payer: Self-pay | Admitting: *Deleted

## 2017-04-30 VITALS — BP 156/78

## 2017-04-30 DIAGNOSIS — N644 Mastodynia: Secondary | ICD-10-CM

## 2017-04-30 DIAGNOSIS — Z1239 Encounter for other screening for malignant neoplasm of breast: Secondary | ICD-10-CM

## 2017-04-30 NOTE — Patient Instructions (Signed)
Explained breast self awareness with [name]. Patient did not need a Pap smear today due to last Pap smear was 11/13/2016. Let her know [facility] will cover Pap smears every 3 years unless has a history of abnormal Pap smears. [facility] and possible right breast ultrasound. Appointment scheduled for Thursday, April 30, 2017 at 0920. [name] verbalized understanding.  Kumar Falwell, Kathaleen Maserhristine Poll, RN 9:46 AM

## 2017-04-30 NOTE — Progress Notes (Signed)
Complaints of right outer breast constant pain and lump that is deep x 5 months. Patient rates the pain in her right breast a 3 out of 10. Patient complained of left outer breast pain that comes and goes. Patient rates the pain in her left breast a 3 out of 10.  Pap Smear: Pap smear not completed today. [facility] and negative with atrophy. Per patient has a history of an abnormal Pap smear 15 years ago that a colposcopy was completed for follow-up. Per patient all Pap smears have been normal since colposcopy. Last two Pap smear results are in Epic.  Physical exam: Breasts Breasts symmetrical. No skin abnormalities bilateral breasts. No nipple retraction bilateral breasts. No nipple discharge bilateral breasts. No lymphadenopathy. No lumps palpated bilateral breasts. Complaints of right breast pain on exam at 11 o'clock 11 cm from the nipple. [facility] and possible right breast ultrasound. Appointment scheduled for Thursday, April 30, 2017 at 0920.        Pelvic/Bimanual No Pap smear completed today since last Pap smear was 11/13/2016. Pap smear not indicated per BCCCP guidelines.   Smoking History: Patient has never smoked.  Patient Navigation: Patient education provided. [facility]. Spanish interpreter provided.   Colorectal Cancer Screening: Per patient has never had a colonoscopy completed. No complaints today. FIT Test given to patient to complete and return to [facility].  Used Spanish interpreter Celanese CorporationErika McReynolds from [employer].

## 2017-05-01 ENCOUNTER — Encounter (HOSPITAL_COMMUNITY): Payer: Self-pay | Admitting: *Deleted

## 2018-05-06 ENCOUNTER — Ambulatory Visit (INDEPENDENT_AMBULATORY_CARE_PROVIDER_SITE_OTHER): Payer: Self-pay | Admitting: Obstetrics & Gynecology

## 2018-05-06 DIAGNOSIS — Z9119 Patient's noncompliance with other medical treatment and regimen: Secondary | ICD-10-CM

## 2018-05-13 ENCOUNTER — Encounter: Payer: Self-pay | Admitting: Obstetrics & Gynecology

## 2018-05-13 NOTE — Progress Notes (Signed)
Appointment canceled.

## 2018-10-13 ENCOUNTER — Other Ambulatory Visit (HOSPITAL_COMMUNITY): Payer: Self-pay | Admitting: *Deleted

## 2018-10-13 DIAGNOSIS — Z1231 Encounter for screening mammogram for malignant neoplasm of breast: Secondary | ICD-10-CM

## 2018-12-20 ENCOUNTER — Other Ambulatory Visit: Payer: Self-pay

## 2018-12-20 DIAGNOSIS — Z20822 Contact with and (suspected) exposure to covid-19: Secondary | ICD-10-CM

## 2018-12-21 LAB — NOVEL CORONAVIRUS, NAA: SARS-CoV-2, NAA: DETECTED — AB

## 2018-12-28 ENCOUNTER — Ambulatory Visit: Payer: No Typology Code available for payment source

## 2018-12-28 ENCOUNTER — Ambulatory Visit (HOSPITAL_COMMUNITY): Payer: No Typology Code available for payment source

## 2019-07-14 ENCOUNTER — Ambulatory Visit: Payer: Self-pay | Admitting: Student

## 2019-07-14 ENCOUNTER — Other Ambulatory Visit: Payer: Self-pay

## 2019-07-14 ENCOUNTER — Encounter: Payer: Self-pay | Admitting: Student

## 2019-07-14 VITALS — BP 148/90 | Temp 98.2°F | Wt 127.0 lb

## 2019-07-14 DIAGNOSIS — Z1239 Encounter for other screening for malignant neoplasm of breast: Secondary | ICD-10-CM

## 2019-07-14 NOTE — Progress Notes (Signed)
[name] is a 59 y.o. female who presents to [facility] clinic today with complaint of bilateral breast pain x 2 weeks. Intermittent. Rates 3/10.    Pap Smear: Pap not smear completed today. Last Pap smear was 11/13/2016 at [facility] clinic and was normal. Per patient has history of abnormal pap smear & colposcopy 17 years ago, normal paps since then. Last Pap smear result is available in Epic.   Physical exam: Breasts Breasts symmetrical. No skin abnormalities bilateral breasts. No nipple retraction bilateral breasts. No nipple discharge bilateral breasts. No lymphadenopathy. No lumps palpated bilateral breasts.       Pelvic/Bimanual Pap is not indicated today    Smoking History: Patient has never smoked.    Patient Navigation: Patient education provided. Access to services provided for patient through [facility] program. Spanish interpreter provided.    Colorectal Cancer Screening: Per patient has never had colonoscopy completed No complaints today.    Breast and Cervical Cancer Risk Assessment: Patient does not have family history of breast cancer, known genetic mutations, or radiation treatment to the chest before age 53. Patient does not have history of cervical dysplasia, immunocompromised, or DES exposure in-utero.  Risk Assessment    Risk Scores      07/14/2019   Last edited by: Narda Rutherford, LPN   5-year risk: 0.6 %   Lifetime risk: 4.1 %          A: BCCCP exam without pap smear Complaint of bilateral breast pain  P: Referred patient to the [facility] for a screening mammogram. Appointment scheduled tomorrow.  Judeth Horn, NP 07/14/2019 12:47 PM

## 2019-07-15 ENCOUNTER — Ambulatory Visit
Admission: RE | Admit: 2019-07-15 | Discharge: 2019-07-15 | Disposition: A | Discharge status: Home / Self Care | Payer: No Typology Code available for payment source | Source: Ambulatory Visit | Attending: Obstetrics and Gynecology | Admitting: Obstetrics and Gynecology

## 2019-07-15 DIAGNOSIS — Z1231 Encounter for screening mammogram for malignant neoplasm of breast: Secondary | ICD-10-CM

## 2020-06-19 ENCOUNTER — Other Ambulatory Visit: Payer: Self-pay | Admitting: Obstetrics and Gynecology

## 2020-06-19 DIAGNOSIS — Z1231 Encounter for screening mammogram for malignant neoplasm of breast: Secondary | ICD-10-CM

## 2020-07-03 ENCOUNTER — Ambulatory Visit: Payer: No Typology Code available for payment source

## 2020-07-19 ENCOUNTER — Ambulatory Visit: Payer: No Typology Code available for payment source | Admitting: *Deleted

## 2020-07-19 ENCOUNTER — Other Ambulatory Visit: Payer: Self-pay

## 2020-07-19 ENCOUNTER — Ambulatory Visit
Admission: RE | Admit: 2020-07-19 | Discharge: 2020-07-19 | Disposition: A | Discharge status: Home / Self Care | Payer: No Typology Code available for payment source | Source: Ambulatory Visit | Attending: Obstetrics and Gynecology | Admitting: Obstetrics and Gynecology

## 2020-07-19 VITALS — BP 162/90 | Wt 126.4 lb

## 2020-07-19 DIAGNOSIS — Z1231 Encounter for screening mammogram for malignant neoplasm of breast: Secondary | ICD-10-CM

## 2020-07-19 DIAGNOSIS — Z01419 Encounter for gynecological examination (general) (routine) without abnormal findings: Secondary | ICD-10-CM

## 2020-07-19 NOTE — Patient Instructions (Signed)
Explained breast self awareness with Melanie Collins. Pap smear completed today. Let her know BCCCP will cover Pap smears and HPV typing every 5 years unless has a history of abnormal Pap smears. Referred patient to the [facility] of [facility] for a screening mammogram on mobile unit. Appointment scheduled Thursday, July 19, 2020 at 1100. Patient escorted to the mobile unit following B[facility]CP appointment for her screening mammogram. Let patient know will follow up with her within the next couple weeks with results of her Pap smear by phone or letter. Informed patient that the Breast Center will follow-up with her within the next couple of weeks with results of her mammogram by letter or phone. Melanie Collins verbalized understanding.  Dionicio Shelnutt, Kathaleen Maser, RN 9:55 AM

## 2020-07-19 NOTE — Progress Notes (Signed)
[name] is a 60 y.o. G3P3 female who presents to [facility] clinic today with complaint of bilateral outer breast pain x 3.5 years that comes and goes. Patient rates the pain at a 3-4 out of 10 within the right breast and 2 out of 10 within the left breast. Previous visits 04/30/2017 and 07/14/2019 patient rated pain at a 3 out of 10. Diagnostic mammogram was completed 04/30/2017 and benign to follow-up for bilateral breast pain.   Pap Smear: Pap smear completed today. Last Pap smear was 11/13/2016 at [facility] and negative with atrophy. Per patient has a history of an abnormal Pap smear 18 years ago that a colposcopy was completed for follow-up. Per patient all Pap smears have been normal since colposcopy. Last two Pap smear results are in Epic.   Physical exam: Breasts Breasts symmetrical. No skin abnormalities bilateral breasts. No nipple retraction bilateral breasts. No nipple discharge bilateral breasts. No lymphadenopathy. No lumps palpated bilateral breasts. No complaints of pain or tenderness on exam.  MM DIAG BREAST W/IMPLANT TOMO BILATERAL  Result Date: 04/30/2017 CLINICAL DATA:  The patient presents with focal pain in the right breast. EXAM: 2D DIGITAL DIAGNOSTIC RIGHT MAMMOGRAM WITH CAD AND ADJUNCT TOMO ULTRASOUND RIGHT BREAST COMPARISON:  Previous exam(s). ACR Breast Density Category b: There are scattered areas of fibroglandular density. FINDINGS: No suspicious masses, calcifications, or distortion are seen in either breast. Mammographic images were processed with CAD. On physical exam, no suspicious lumps are identified. Targeted ultrasound is performed, showing no abnormalities in the region of the patient's focal pain. IMPRESSION: No mammographic or sonographic evidence of malignancy. RECOMMENDATION: Annual screening mammography. Treatment of the patient's symptoms should be based on clinical and physical exam given the lack imaging findings. I have discussed  the findings and recommendations with the patient. Results were also provided in writing at the conclusion of the visit. If applicable, a reminder letter will be sent to the patient regarding the next appointment. BI-RADS CATEGORY  2: Benign. Electronically Signed   By: Gerome Sam III M.D   On: 04/30/2017 10:15   MS DIGITAL SCREENING TOMO W/IMPLANTS BILAT  Result Date: 07/15/2019 CLINICAL DATA:  Screening. EXAM: DIGITAL SCREENING BILATERAL MAMMOGRAM WITH IMPLANTS, CAD AND TOMO The patient has retropectoral implants. Standard and implant displaced views were performed. COMPARISON:  Previous exam(s). ACR Breast Density Category b: There are scattered areas of fibroglandular density. FINDINGS: There are no findings suspicious for malignancy. Images were processed with CAD. IMPRESSION: No mammographic evidence of malignancy. A result letter of this screening mammogram will be mailed directly to the patient. RECOMMENDATION: Screening mammogram in one year. (Code:SM-B-01Y) BI-RADS CATEGORY  1:  Negative. Electronically Signed   By: Amie Portland M.D.   On: 07/15/2019 13:16    Pelvic/Bimanual Ext Genitalia No lesions, no swelling and no discharge observed on external genitalia.        Vagina Vagina pink and normal texture. No lesions or discharge observed in vagina.        Cervix Cervix is present. Cervix pink and of normal texture. No discharge observed.    Uterus Uterus is present and palpable. Uterus in normal position and normal size.        Adnexae Bilateral ovaries present and palpable. No tenderness on palpation.         Rectovaginal No rectal exam completed today since patient had no rectal complaints. No skin abnormalities observed on exam.     Smoking History: Patient has never smoked.  Patient Navigation: Patient education provided. Access to services provided for patient through [facility] program. Spanish interpreter [name] from [facility] provided.   Colorectal Cancer  Screening: Per patient has never had colonoscopy completed. No complaints today.    Breast and Cervical Cancer Risk Assessment: Patient does not have family history of breast cancer, known genetic mutations, or radiation treatment to the chest before age 86. Per patient has history of cervical dysplasia. Patient has no history of being immunocompromised or DES exposure in-utero.  Risk Assessment    Risk Scores      07/19/2020 07/14/2019   Last edited by: Meryl Dare, CMA [place], Sherie Demetrius Charity, LPN   5-year risk: 0.6 % 0.6 %   Lifetime risk: 4 % 4.1 %          A: BCCCP exam with pap smear Complaint of bilateral outer breast pain.  P: Referred patient to the [facility] Center of [facility] for a screening mammogram on mobile unit. Appointment scheduled Thursday, July 19, 2020 at 1100.  Priscille Heidelberg, RN 07/19/2020 9:55 AM

## 2020-07-20 LAB — CYTOLOGY - PAP
Comment: NEGATIVE
Diagnosis: NEGATIVE
High risk HPV: NEGATIVE

## 2020-07-23 ENCOUNTER — Telehealth: Payer: Self-pay

## 2020-07-23 NOTE — Telephone Encounter (Signed)
Attempted to call patient to give pap smear results via [name], [facility]. Left name and number for patient to call back.

## 2020-07-24 ENCOUNTER — Telehealth: Payer: Self-pay

## 2020-07-24 NOTE — Telephone Encounter (Signed)
Attempted to call patient to give pap smear results. Left name and number for patient to call back. Will mail result letter to patient.

## 2020-07-24 NOTE — Telephone Encounter (Signed)
Called patient to give pap smear results via Erika McReynolds, [employer]. Informed patient that pap smear was normal and HPV was negative. Based on this result her next pap smear will be due in 5 years. Patient voiced understanding.  

## 2020-08-27 ENCOUNTER — Other Ambulatory Visit: Payer: Self-pay

## 2020-08-27 ENCOUNTER — Inpatient Hospital Stay: Payer: Self-pay | Attending: Obstetrics and Gynecology | Admitting: *Deleted

## 2020-08-27 VITALS — BP 138/88 | Ht 61.0 in | Wt 120.1 lb

## 2020-08-27 DIAGNOSIS — Z0000 Encounter for general adult medical examination without abnormal findings: Secondary | ICD-10-CM

## 2020-08-27 NOTE — Progress Notes (Signed)
[facility] initial screening   Interpreter- [name], [place]   Clinical Measurement:  Vitals:   08/27/20 0832 08/27/20 0851  BP: 130/88 138/88   Fasting Labs Drawn Today, will review with patient when they result.   Medical History:  Patient states that she does not know id she has  high cholesterol, high blood pressure or diabetes.  Medications:  Patient states that she does not take medication to lower cholesterol, blood pressure or blood sugar.  Patient does not take an aspirin a day to help prevent a heart attack or stroke.    Blood pressure, self measurement: Patient states that she does not measure blood pressure from home. She checks her blood pressure N/A. She shares her readings with a health care provider: N/A.   Nutrition: Patient states that on average she eats 1 cups of fruit and 1 cups of vegetables per day. Patient states that she does not eat fish at least 2 times per week. Patient eats less than half servings of whole grains. Patient drinks less than 36 ounces of beverages with added sugar weekly: yes. Patient is currently watching sodium or salt intake: yes. In the past 7 days patient has consumed drinks containing alcohol on 0 days. On a day that patient consumes drinks containing alcohol on average 3 drinks are consumed.      Physical activity:  Patient states that she gets 90 minutes of moderate and 90 minutes of vigorous physical activity each week.  Smoking status:  Patient states that she has has never smoked .   Quality of life:  Over the past 2 weeks patient states that she had little interest or pleasure in doing things: several days. She has been feeling down, depressed or hopeless:not at all.    Risk reduction and counseling:   Health Coaching: Spoke with patient about blood pressure. Explained to patient that she is in the pre-hypertensive stage with her blood pressure. Patient has had elevated BP readings during past BCCCP visits. Encouraged patient to  continue watching salt intake and to continue with exercising. Explained to patient that the recommendation is for 2 cups of fruit and 3 cups of vegetables per day. Explained to patient how heart healthy fish and whole grains can help keep cholesterol levels down. Gave examples to patient of different types of whole grains and heart healthy fish that she can try incorporating into diet.    Navigation:  I will notify patient of lab results.  Patient is aware of 2 more health coaching sessions and a follow up.  Time: 25 minutes

## 2020-08-28 LAB — HEMOGLOBIN A1C
Est. average glucose Bld gHb Est-mCnc: 114 mg/dL
Hgb A1c MFr Bld: 5.6 % (ref 4.8–5.6)

## 2020-08-28 LAB — LIPID PANEL
Chol/HDL Ratio: 3.9 ratio (ref 0.0–4.4)
Cholesterol, Total: 218 mg/dL — ABNORMAL HIGH (ref 100–199)
HDL: 56 mg/dL (ref >39)
LDL Chol Calc (NIH): 140 mg/dL — ABNORMAL HIGH (ref 0–99)
Triglycerides: 124 mg/dL (ref 0–149)
VLDL Cholesterol Cal: 22 mg/dL (ref 5–40)

## 2020-08-28 LAB — GLUCOSE, RANDOM: Glucose: 101 mg/dL — ABNORMAL HIGH (ref 65–99)

## 2020-09-04 ENCOUNTER — Telehealth: Payer: Self-pay

## 2020-09-04 NOTE — Telephone Encounter (Signed)
Health coaching 2   interpreter- [name], [employer]   Labs- 201 cholesterol, 113 LDL cholesterol, 124 triglycerides, 66 HDL cholesterol, 5.4 hemoglobin A1C, 91 mean plasma glucose. Patient understands and is aware of her lab results.   Goals-  1. Reduce the amount of fried and fatty foods consumed. Try to grill, bake, broil or sautee foods instead. 2. Reduce the amount of red meat consumed. Try to substitute for lean proteins like chicken and fish.  3. Reduce the amount of whole fat consumed. Substitute for low-fat or reduced-fat options instead. 4. Increase the amount whole grains and heart healthy fish consumed. 5. Reduce the amount of carbs consumed. Patient states that she does not consume a lot of sweets in food or drink but does eat more carbs than she should. Spoke with patient about the daily recommendation for the grain group. Explained to patient what a serving would look like for different types of carbs that she eats often.    Navigation:  Patient is aware of 1 more health coaching sessions and a follow up. Will call patient with follow-up appointment information for Internal Medicine once appointment is scheduled.   Time- 16 minutes

## 2020-09-04 NOTE — Telephone Encounter (Signed)
Left message for patient about lab results from [facility]. Left name and number for patient to call back.   

## 2020-09-10 ENCOUNTER — Telehealth: Payer: Self-pay

## 2020-09-10 NOTE — Telephone Encounter (Signed)
error 

## 2020-09-10 NOTE — Telephone Encounter (Signed)
Called patient via [name], Melanie Collins to give new appointment information for Internal Medicine. Informed patient that appointment had been rescheduled and new appointment is on September 21, 2020 @ 9:15 am.

## 2020-09-14 ENCOUNTER — Encounter: Payer: No Typology Code available for payment source | Admitting: Student

## 2020-09-21 ENCOUNTER — Ambulatory Visit (INDEPENDENT_AMBULATORY_CARE_PROVIDER_SITE_OTHER): Payer: Self-pay | Admitting: Internal Medicine

## 2020-09-21 ENCOUNTER — Encounter: Payer: Self-pay | Admitting: Internal Medicine

## 2020-09-21 ENCOUNTER — Other Ambulatory Visit: Payer: Self-pay

## 2020-09-21 DIAGNOSIS — E785 Hyperlipidemia, unspecified: Secondary | ICD-10-CM | POA: Insufficient documentation

## 2020-09-21 DIAGNOSIS — Z0000 Encounter for general adult medical examination without abnormal findings: Secondary | ICD-10-CM

## 2020-09-21 DIAGNOSIS — Z5989 Other problems related to housing and economic circumstances: Secondary | ICD-10-CM

## 2020-09-21 HISTORY — DX: Hyperlipidemia, unspecified: E78.5

## 2020-09-21 NOTE — Assessment & Plan Note (Signed)
She presents for follow up from [facility]. A1C is 5.6 Mammogram performed 07/2020--BI-RADS 1 -continue annual screening Pap smear performed 07/2020--NILM -repeat in 3 years  She is due for colorectal cancer screening however financial status/uninsured status is precluding completion.  She is due for Tdap and Shingrix however financial status/uninsured status is precluding completion.  She is due for HIV screening at her next office visit.

## 2020-09-21 NOTE — Assessment & Plan Note (Signed)
She presents for follow up from the [facility] program. 10Y ASCVD risk: 2.7% Statin therapy is not indicated at this time -lifestyle modifications discussed at today's visit

## 2020-09-21 NOTE — Progress Notes (Signed)
New Patient Office Visit  Subjective:  Patient ID: Melanie Collins, female    DOB: 1961/02/20  Age: 60 y.o. MRN: [id number]  CC:  Chief Complaint  Patient presents with   Hyperlipidemia     Pt came from [facility] due to elevated lipids     Diabetes    Pt came from [facility] for a1c CHECK     HPI Melanie Collins Melanie Collins presents for the above.  No past medical history on file.  Past Surgical History:  Procedure Laterality Date   AUGMENTATION MAMMAPLASTY     NOSE SURGERY      Family History  Problem Relation Age of Onset   Hypertension Mother    Diabetes Mother    Cancer Sister        UTERINE   Heart disease Father     Social History   Socioeconomic History   Marital status: Married    Spouse name: Not on file   Number of children: 3   Years of education: Not on file   Highest education level: 12th grade  Occupational History   Not on file  Tobacco Use   Smoking status: Never   Smokeless tobacco: Never  Vaping Use   Vaping Use: Never used  Substance and Sexual Activity   Alcohol use: Yes    Comment: OCC   Drug use: No   Sexual activity: Not Currently    Birth control/protection: Post-menopausal  Other Topics Concern   Not on file  Social History Narrative   Not on file   Social Determinants of Health   Financial Resource Strain: Not on file  Food Insecurity: Not on file  Transportation Needs: Unmet Transportation Needs   Lack of Transportation (Medical): Yes   Lack of Transportation (Non-Medical): Yes  Physical Activity: Not on file  Stress: Not on file  Social Connections: Not on file  Intimate Partner Violence: Not on file    Objective:   Today's Vitals: BP 130/61 (BP Location: Left Arm, Patient Position: Sitting, Cuff Size: Normal)   Pulse 64   Temp 97.7 F (36.5 C) (Oral)   Ht 5\' 1"  (1.549 m)   Wt 118 lb 3.2 oz (53.6 kg)   LMP 07/06/2011   SpO2 100%   BMI 22.33 kg/m   Physical exam General: well  appearing Cardiac: RRR Pulm: lungs clear  Assessment & Plan:   Problem List Items Addressed This Visit       Other   Hyperlipidemia    She presents for follow up from the [facility] program. 10Y ASCVD risk: 2.7% Statin therapy is not indicated at this time -lifestyle modifications discussed at today's visit       Healthcare maintenance    She presents for follow up from [facility]. A1C is 5.6 Mammogram performed 07/2020--BI-RADS 1 -continue annual screening Pap smear performed 07/2020--NILM -repeat in 3 years  She is due for colorectal cancer screening however financial status/uninsured status is precluding completion.  She is due for Tdap and Shingrix however financial status/uninsured status is precluding completion.  She is due for HIV screening at her next office visit.        Uninsured    Orange card packet provided to the patient at today's visit. She was instructed to follow up in our office for establishment of care after receiving the orange card.        Outpatient Encounter Medications as of 09/21/2020  Medication Sig   Multiple Vitamin (MULTIVITAMIN) tablet  Take 1 tablet by mouth daily.   No facility-administered encounter medications on file as of 09/21/2020.    Follow-up: after obtaining the orange card  Elige Radon, MD Internal Medicine Resident PGY-2 [facility] Pager: [phone] 09/21/2020 3:42 PM

## 2020-09-21 NOTE — Patient Instructions (Signed)
It was a pleasure meeting you today! After you apply and receive the [facility], please arrange an appointment to establish care with a provider in our clinic.

## 2020-09-21 NOTE — Assessment & Plan Note (Signed)
Orange card packet provided to the patient at today's visit. She was instructed to follow up in our office for establishment of care after receiving the orange card.

## 2020-09-24 NOTE — Progress Notes (Signed)
Internal Medicine Clinic Attending  Case discussed with Dr. Christian  At the time of the visit.  We reviewed the resident's history and exam and pertinent patient test results.  I agree with the assessment, diagnosis, and plan of care documented in the resident's note.  

## 2020-10-09 ENCOUNTER — Encounter: Payer: Self-pay | Admitting: *Deleted

## 2021-06-10 ENCOUNTER — Telehealth: Payer: Self-pay

## 2021-06-10 NOTE — Telephone Encounter (Signed)
Left message via Rudene Anda, [facility] for patient about completing [facility] for the [facility] program. Left name and number for patient to call back.  ?

## 2021-08-22 ENCOUNTER — Other Ambulatory Visit: Payer: Self-pay

## 2021-08-22 DIAGNOSIS — Z1231 Encounter for screening mammogram for malignant neoplasm of breast: Secondary | ICD-10-CM

## 2021-10-16 ENCOUNTER — Inpatient Hospital Stay: Payer: Self-pay | Attending: Obstetrics and Gynecology | Admitting: *Deleted

## 2021-10-16 ENCOUNTER — Other Ambulatory Visit: Payer: Self-pay

## 2021-10-16 VITALS — BP 142/90 | Ht 61.0 in | Wt 114.2 lb

## 2021-10-16 DIAGNOSIS — Z0000 Encounter for general adult medical examination without abnormal findings: Secondary | ICD-10-CM

## 2021-10-16 NOTE — Progress Notes (Signed)
[facility] initial screening   Interpreter- [name], [employer]   Clinical Measurement: There were no vitals filed for this visit. Fasting Labs Drawn Today, will review with patient when they result.   Medical History:  Patient states that she  does not know if she has  high cholesterol, has high blood pressure and she does not have diabetes.  Medications:  Patient states that she does not take medication to lower cholesterol, blood pressure or blood sugar.  Patient does not take an aspirin a day to help prevent a heart attack or stroke.   Blood pressure, self measurement: Patient states that she does measure blood pressure from home. She checks her blood pressure monthly. She shares her readings with a health care provider: no.   Nutrition: Patient states that on average she eats 2 cups of fruit and 2 cups of vegetables per day. Patient states that she does not eat fish at least 2 times per week. Patient eats more than half servings of whole grains. Patient drinks less than 36 ounces of beverages with added sugar weekly: yes. Patient is currently watching sodium or salt intake: yes. In the past 7 days patient has consumed drinks containing alcohol on 0 days. On a day that patient consumes drinks containing alcohol on average 0 drinks are consumed.      Physical activity:  Patient states that she gets 210 minutes of moderate and 210 minutes of vigorous physical activity each week.  Smoking status:  Patient states that she has has never smoked .   Quality of life:  Over the past 2 weeks patient states that she had little interest or pleasure in doing things: not at all. She has been feeling down, depressed or hopeless:not at all.    Risk reduction and counseling:   Health Coaching: After the patient's screening last year she has been working on making changes with her health. Patient wanted to lower her cholesterol levels and glucose. Patient has been eating 2 cups of fruits and vegetables  daily. Patient has been consuming salmon or tuna once a week. Patient consumes a variety of whole grains (whole wheat bread, oatmeal, whole grain cereals, brown rice and whole wheat pasta). Patient has been walking or running daily for an hour. Encouraged patient to continue watching the amount of sodium that she consumes due to elevated BP. Offered to refer patient to Internal Medicine for FU for her BP but patient declined. Patient prefers to follow-up with [facility].   Goal: Patient did not want to set a new goal. Patient will work on maintaining the changes that she has made over the past year.     Navigation:  I will notify patient of lab results.  Patient is aware of 2 more health coaching sessions and a follow up.  Time: 25 minutes

## 2021-10-17 ENCOUNTER — Ambulatory Visit: Payer: Self-pay | Admitting: *Deleted

## 2021-10-17 ENCOUNTER — Ambulatory Visit
Admission: RE | Admit: 2021-10-17 | Discharge: 2021-10-17 | Disposition: A | Discharge status: Home / Self Care | Payer: No Typology Code available for payment source | Source: Ambulatory Visit | Attending: Obstetrics and Gynecology | Admitting: Obstetrics and Gynecology

## 2021-10-17 VITALS — BP 138/98 | Wt 114.0 lb

## 2021-10-17 DIAGNOSIS — Z1239 Encounter for other screening for malignant neoplasm of breast: Secondary | ICD-10-CM

## 2021-10-17 DIAGNOSIS — Z1211 Encounter for screening for malignant neoplasm of colon: Secondary | ICD-10-CM

## 2021-10-17 DIAGNOSIS — Z1231 Encounter for screening mammogram for malignant neoplasm of breast: Secondary | ICD-10-CM

## 2021-10-17 LAB — LIPID PANEL
Chol/HDL Ratio: 4 ratio (ref 0.0–4.4)
Cholesterol, Total: 213 mg/dL — ABNORMAL HIGH (ref 100–199)
HDL: 53 mg/dL (ref >39)
LDL Chol Calc (NIH): 133 mg/dL — ABNORMAL HIGH (ref 0–99)
Triglycerides: 151 mg/dL — ABNORMAL HIGH (ref 0–149)
VLDL Cholesterol Cal: 27 mg/dL (ref 5–40)

## 2021-10-17 LAB — GLUCOSE, RANDOM: Glucose: 103 mg/dL — ABNORMAL HIGH (ref 70–99)

## 2021-10-17 LAB — HEMOGLOBIN A1C
Est. average glucose Bld gHb Est-mCnc: 114 mg/dL
Hgb A1c MFr Bld: 5.6 % (ref 4.8–5.6)

## 2021-10-17 NOTE — Patient Instructions (Signed)
Explained breast self awareness with Melanie Collins. Patient did not need a Pap smear today due to last Pap smear and HPV typing was 07/19/2020. Let her know BCCCP will cover Pap smears and HPV typing every 5 years unless has a history of abnormal Pap smears. Referred patient to the [facility] Center of [facility] for a screening mammogram on mobile unit. Appointment scheduled Thursday, October 17, 2021 at 1530. Patient aware of appointment and will be there. Let patient know the Breast Center will follow up with her within the next couple weeks with results of her mammogram by letter or phone. Melanie Collins verbalized understanding.  Sorah Falkenstein, Kathaleen Maser, RN 3:12 PM

## 2021-10-17 NOTE — Progress Notes (Signed)
Melanie Collins is a 61 y.o. female who presents to [facility] clinic today with no complaints.    Pap Smear: Pap smear not completed today. Last Pap smear was 07/19/2020 at [facility] and normal with negative HPV. Per patient has a history of an abnormal Pap smear 19-30 years ago that a colposcopy was completed for follow-up. Per patient all Pap smears have been normal since colposcopy. Last three Pap smear results are in Epic.  Physical exam: Breasts Breasts symmetrical. No skin abnormalities bilateral breasts. No nipple retraction bilateral breasts. No nipple discharge bilateral breasts. No lymphadenopathy. No lumps palpated bilateral breasts. No complaints of pain or tenderness on exam.  MS DIGITAL SCREENING TOMO W/IMPLANTS BILAT  Result Date: 07/20/2020 CLINICAL DATA:  Screening. EXAM: DIGITAL SCREENING BILATERAL MAMMOGRAM WITH IMPLANTS, CAD AND TOMOSYNTHESIS TECHNIQUE: Bilateral screening digital craniocaudal and mediolateral oblique mammograms were obtained. Bilateral screening digital breast tomosynthesis was performed. The images were evaluated with computer-aided detection. Standard and/or implant displaced views were performed. COMPARISON:  Previous exam(s). ACR Breast Density Category b: There are scattered areas of fibroglandular density. FINDINGS: The patient has retropectoral implants. There are no findings suspicious for malignancy. IMPRESSION: No mammographic evidence of malignancy. A result letter of this screening mammogram will be mailed directly to the patient. RECOMMENDATION: Screening mammogram in one year. (Code:SM-B-01Y) BI-RADS CATEGORY  1:  Negative. Electronically Signed   By: Harmon Pier M.D.   On: 07/20/2020 07:58   MS DIGITAL SCREENING TOMO W/IMPLANTS BILAT  Result Date: 07/15/2019 CLINICAL DATA:  Screening. EXAM: DIGITAL SCREENING BILATERAL MAMMOGRAM WITH IMPLANTS, CAD AND TOMO The patient has retropectoral implants. Standard and implant displaced views were  performed. COMPARISON:  Previous exam(s). ACR Breast Density Category b: There are scattered areas of fibroglandular density. FINDINGS: There are no findings suspicious for malignancy. Images were processed with CAD. IMPRESSION: No mammographic evidence of malignancy. A result letter of this screening mammogram will be mailed directly to the patient. RECOMMENDATION: Screening mammogram in one year. (Code:SM-B-01Y) BI-RADS CATEGORY  1:  Negative. Electronically Signed   By: Amie Portland M.D.   On: 07/15/2019 13:16   MM DIAG BREAST W/IMPLANT TOMO BILATERAL  Result Date: 04/30/2017 CLINICAL DATA:  The patient presents with focal pain in the right breast. EXAM: 2D DIGITAL DIAGNOSTIC RIGHT MAMMOGRAM WITH CAD AND ADJUNCT TOMO ULTRASOUND RIGHT BREAST COMPARISON:  Previous exam(s). ACR Breast Density Category b: There are scattered areas of fibroglandular density. FINDINGS: No suspicious masses, calcifications, or distortion are seen in either breast. Mammographic images were processed with CAD. On physical exam, no suspicious lumps are identified. Targeted ultrasound is performed, showing no abnormalities in the region of the patient's focal pain. IMPRESSION: No mammographic or sonographic evidence of malignancy. RECOMMENDATION: Annual screening mammography. Treatment of the patient's symptoms should be based on clinical and physical exam given the lack imaging findings. I have discussed the findings and recommendations with the patient. Results were also provided in writing at the conclusion of the visit. If applicable, a reminder letter will be sent to the patient regarding the next appointment. BI-RADS CATEGORY  2: Benign. Electronically Signed   By: Gerome Sam III M.D   On: 04/30/2017 10:15         Pelvic/Bimanual Pap is not indicated today per BCCCP guidelines.   Smoking History: Patient has never smoked.   Patient Navigation: Patient education provided. Access to services provided for patient  through [place] program. Spanish interpreter [name] from [facility] provided.   Colorectal Cancer  Screening: Per patient has never had colonoscopy completed. FIT Test will be mailed to patient to complete. No complaints today.    Breast and Cervical Cancer Risk Assessment: Patient does not have family history of breast cancer, known genetic mutations, or radiation treatment to the chest before age 80. Per patient has history of cervical dysplasia. Patient has no history of being immunocompromised or DES exposure in-utero.  Risk Assessment     Risk Scores       10/17/2021 07/19/2020   Last edited by: Meryl Dare, CMA Meryl Dare, CMA   5-year risk: 0.6 % 0.6 %   Lifetime risk: 3.9 % 4 %            A: BCCCP exam without pap smear No complaints.  P: Referred patient to the [facility] of [facility] for a screening mammogram on mobile unit. Appointment scheduled Thursday, October 17, 2021 at 1530.  Priscille Heidelberg, RN 10/17/2021 3:12 PM

## 2021-10-24 ENCOUNTER — Telehealth: Payer: Self-pay

## 2021-10-24 NOTE — Telephone Encounter (Signed)
Health coaching 2   interpreter- [name], [facility]   Labs-213 cholesterol, 133 LDL cholesterol, 151 triglycerides, 53 HDL cholesterol, 5.6 hemoglobin A1C, 103 mean plasma glucose.  Patient understands and is aware of her lab results.   Goals-  1. Continue practicing a heart healthy diet. 2. Continue with daily exercise. 3. Continue watching the amounts of sweets and sugars consumed, as well as carbs.   Navigation:  Patient is aware of 1 more health coaching sessions and a follow up. Will refer patient to [facility] for FU for elevated labs and BP.  Time- 13 minutes

## 2021-10-24 NOTE — Telephone Encounter (Signed)
Left message for patient about lab results from [facility]. Left name and number for patient to call back.   

## 2021-11-02 LAB — FECAL OCCULT BLOOD, IMMUNOCHEMICAL: Fecal Occult Bld: NEGATIVE

## 2021-11-04 ENCOUNTER — Telehealth: Payer: Self-pay

## 2021-11-04 NOTE — Telephone Encounter (Signed)
Via Julie Sowell, Spanish Interpreter ([place]), Patient informed negative FIT test results. Patient verbalized understanding.  

## 2021-12-02 ENCOUNTER — Ambulatory Visit: Payer: Self-pay | Attending: Family Medicine | Admitting: Family Medicine

## 2021-12-02 ENCOUNTER — Encounter: Payer: Self-pay | Admitting: Family Medicine

## 2021-12-02 VITALS — BP 144/72 | HR 71 | Temp 98.3°F | Ht 61.0 in | Wt 118.0 lb

## 2021-12-02 DIAGNOSIS — E785 Hyperlipidemia, unspecified: Secondary | ICD-10-CM

## 2021-12-02 DIAGNOSIS — Z23 Encounter for immunization: Secondary | ICD-10-CM

## 2021-12-02 DIAGNOSIS — Z1159 Encounter for screening for other viral diseases: Secondary | ICD-10-CM

## 2021-12-02 DIAGNOSIS — I10 Essential (primary) hypertension: Secondary | ICD-10-CM

## 2021-12-02 HISTORY — DX: Essential (primary) hypertension: I10

## 2021-12-02 MED ORDER — LOSARTAN POTASSIUM 25 MG PO TABS: 25.0000 mg | ORAL_TABLET | Freq: Every day | ORAL | 1 refills | Status: AC

## 2021-12-02 NOTE — Patient Instructions (Signed)
Plan de alimentacin DASH DASH Eating Plan DASH es la sigla en ingls de "Enfoques Alimentarios para Detener la Hipertensin". El plan de alimentacin DASH ha demostrado: Bajar la presin arterial elevada (hipertensin). Reducir el riesgo de diabetes tipo 2, enfermedad cardaca y accidente cerebrovascular. Ayudar a perder peso. Consejos para seguir este plan Leer las etiquetas de los alimentos Verifique la cantidad de sal (sodio) por porcin en las etiquetas de los alimentos. Elija alimentos con menos del 5 por ciento del valor diario de sodio. Generalmente, los alimentos con menos de 300 miligramos (mg) de sodio por porcin se encuadran dentro de este plan alimentario. Para encontrar cereales integrales, busque la palabra "integral" como primera palabra en la lista de ingredientes. Al ir de compras Compre productos en los que en su etiqueta diga: "bajo contenido de sodio" o "sin agregado de sal". Compre alimentos frescos. Evite los alimentos enlatados y comidas precocidas o congeladas. Al cocinar Evite agregar sal cuando cocine. Use hierbas o aderezos sin sal, en lugar de sal de mesa o sal marina. Consulte al mdico o farmacutico antes de usar sustitutos de la sal. No fra los alimentos. A la hora de cocinar los alimentos opte por hornearlos, hervirlos, grillarlos, asarlos al horno y asarlos a la parrilla. Cocine con aceites cardiosaludables, como oliva, canola, aguacate, soja o girasol. Planificacin de las comidas  Consuma una dieta equilibrada, que incluya lo siguiente: 4 o ms porciones de frutas y 4 o ms porciones de verduras por da. Trate de que medio plato de cada comida sea de frutas y verduras. De 6 a 8 porciones de cereales integrales todos los das. Menos de 6 onzas (170 g) de carne, aves o pescado magros por da. Una porcin de 3 onzas (85 g) de carne tiene casi el mismo tamao que un mazo de cartas. Un huevo equivale a 1 onza (28 g). De 2 a 3 porciones de productos lcteos  descremados por da. Una porcin es 1 taza (237 ml). 1 porcin de frutos secos, semillas o frijoles 5 veces por semana. De 2 a 3 porciones de grasas cardiosaludables. Las grasas saludables llamadas cidos grasos omega-3 se encuentran en alimentos como las nueces, las semillas de lino, las leches fortificadas y los huevos. Estas grasas tambin se encuentran en los pescados de agua fra, como la sardina, el salmn y la caballa. Limite la cantidad que consume de: Alimentos enlatados o envasados. Alimentos con alto contenido de grasa trans, como algunos alimentos fritos. Alimentos con alto contenido de grasa saturada, como carne con grasa. Postres y otros dulces, bebidas azucaradas y otros alimentos con azcar agregada. Productos lcteos enteros. No le agregue sal a los alimentos antes de probarlos. No coma ms de 4 yemas de huevo por semana. Trate de comer al menos 2 comidas vegetarianas por semana. Consuma ms comida casera y menos de restaurante, de bares y comida rpida. Estilo de vida Cuando coma en un restaurante, pida que preparen su comida con menos sal o, en lo posible, sin nada de sal. Si bebe alcohol: Limite la cantidad que bebe: De 0 a 1 medida por da para las mujeres que no estn embarazadas. De 0 a 2 medidas por da para los hombres. Est atento a la cantidad de alcohol que hay en las bebidas que toma. En los [place], una medida equivale a una botella de cerveza de 12 oz (355 ml), un vaso de vino de 5 oz (148 ml) o un vaso de una bebida alcohlica de alta graduacin de 1 oz (  44 ml). Informacin general Evite ingerir ms de 2300 mg de sal por da. Si tiene hipertensin, es posible que necesite reducir la ingesta de sodio a 1,500 mg por da. Trabaje con su mdico para mantener un peso saludable o perder peso. Pregntele cul es el peso recomendado para usted. Realice al menos 30 minutos de ejercicio que haga que se acelere su corazn (ejercicio aerbico) la mayora de los das  de la semana. Estas actividades pueden incluir caminar, nadar o andar en bicicleta. Trabaje con su mdico o nutricionista para ajustar su plan alimentario a sus necesidades calricas personales. Qu alimentos debo comer? Frutas Todas las frutas frescas, congeladas o disecadas. Frutas enlatadas en jugo natural (sin agregado de azcar). Verduras Verduras frescas o congeladas (crudas, al vapor, asadas o grilladas). Jugos de tomate y verduras con bajo contenido de sodio o reducidos en sodio. Salsa y pasta de tomate con bajo contenido de sodio o reducidas en sodio. Verduras enlatadas con bajo contenido de sodio o reducidas en sodio. Granos Pan de salvado o integral. Pasta de salvado o integral. Arroz integral. Avena. Quinua. Trigo burgol. Cereales integrales y con bajo contenido de sodio. Pan pita. Galletitas de agua con bajo contenido de grasa y sodio. Tortillas de harina integral. Carnes y otras protenas Pollo o pavo sin piel. Carne de pollo o de pavo molida. Cerdo desgrasado. Pescado y mariscos. Claras de huevo. Porotos, guisantes o lentejas secos. Frutos secos, mantequilla de frutos secos y semillas sin sal. Frijoles enlatados sin sal. Cortes de carne vacuna magra, desgrasada. Carne precocida o curada magra y baja en sodio, como embutidos o panes de carne. Lcteos Leche descremada (1 %) o descremada. Quesos reducidos en grasa, con bajo contenido de grasa o descremados. Queso blanco o ricota sin grasa, con bajo contenido de sodio. Yogur semidescremado o descremado. Queso con bajo contenido de grasa y sodio. Grasas y aceites Margarinas untables que no contengan grasas trans. Aceite vegetal. Mayonesa y aderezos para ensaladas livianos, reducidos en grasa o con bajo contenido de grasas (reducidos en sodio). Aceite de canola, crtamo, oliva, aguacate, soja y girasol. Aguacate. Alios y condimentos Hierbas. Especias. Mezclas de condimentos sin sal. Otros alimentos Palomitas de maz y pretzels sin sal.  Dulces con bajo contenido de grasas. Es posible que los productos que se enumeran ms arriba no constituyan una lista completa de los alimentos y las bebidas que puede tomar. Consulte a un nutricionista para obtener ms informacin. Qu alimentos debo evitar? Frutas Fruta enlatada en almbar liviano o espeso. Frutas cocidas en aceite. Frutas con salsa de crema o mantequilla. Verduras Verduras con crema o fritas. Verduras en salsa de queso. Verduras enlatadas regulares (que no sean con bajo contenido de sodio o reducidas en sodio). Pasta y salsa de tomates enlatadas regulares (que no sean con bajo contenido de sodio o reducidas en sodio). Jugos de tomate y verduras regulares (que no sean con bajo contenido de sodio o reducidos en sodio). Pepinillos. Aceitunas. Granos Productos de panificacin hechos con grasa, como medialunas, magdalenas y algunos panes. Comidas con arroz o pasta seca listas para usar. Carnes y otras protenas Cortes de carne con alto contenido de grasa. Costillas. Carne frita. Tocino. Mortadela, salame y otras carnes precocidas o curadas, como embutidos o panes de carne. Grasa de la espalda del cerdo (panceta). Salchicha de cerdo. Frutos secos y semillas con sal. Frijoles enlatados con agregado de sal. Pescado enlatado o ahumado. Huevos enteros o yemas. Pollo o pavo con piel. Lcteos Leche entera o al 2 %, crema   y mitad leche y mitad crema. Queso crema entero o con toda su grasa. Yogur entero o endulzado. Quesos con toda su grasa. Sustitutos de cremas no lcteas. Coberturas batidas. Quesos para untar y quesos procesados. Grasas y aceites Mantequilla. Margarina en barra. Manteca de cerdo. Lardo. Mantequilla clarificada. Grasa de panceta. Aceites tropicales como aceite de coco, palmiste o palma. Alios y condimentos Sal de cebolla, sal de ajo, sal condimentada, sal de mesa y sal marina. Salsa [place]. Salsa trtara. Salsa barbacoa. Salsa teriyaki. Salsa de soja, incluso la que  tiene contenido reducido de sodio. Salsa de carne. Salsas en lata y envasadas. Salsa de pescado. Salsa de ostras. Salsa rosada. Rbanos picantes comprados en tiendas. [place]up. Mostaza. Saborizantes y tiernizantes para carne. Caldo en cubitos. Salsas picantes. Adobos preelaborados o envasados. Aderezos para tacos preelaborados o envasados. Salsas de pepinillos. Aderezos comunes para ensalada. Otros alimentos Palomitas de maz y pretzels con sal. Es posible que los productos que se enumeran ms arriba no constituyan una lista completa de los alimentos y las bebidas que debe evitar. Consulte a un nutricionista para obtener ms informacin. Dnde buscar ms informacin National Heart, Lung, and Blood Institute (Instituto Nacional del Corazn, los Pulmones y la Sangre): www.nhlbi.nih.gov American Heart Association (Asociacin Estadounidense del Corazn): www.heart.org Academy of Nutrition and Dietetics (Academia de Nutricin y Diettica): www.eatright.org National Kidney Foundation (Fundacin Nacional del Rin): www.kidney.org Resumen El plan de alimentacin DASH ha demostrado bajar la presin arterial elevada (hipertensin). Tambin puede reducir el riesgo de diabetes tipo 2, enfermedad cardaca y accidente cerebrovascular. Cuando siga el plan de alimentacin DASH, trate de comer ms frutas frescas y verduras, cereales integrales, carnes magras, lcteos descremados y grasas cardiosaludables. Con el plan de alimentacin DASH, deber limitar el consumo de sal (sodio) a 2,300 mg por da. Si tiene hipertensin, es posible que necesite reducir la ingesta de sodio a 1,500 mg por da. Trabaje con su mdico o nutricionista para ajustar su plan alimentario a sus necesidades calricas personales. Esta informacin no tiene como fin reemplazar el consejo del mdico. Asegrese de hacerle al mdico cualquier pregunta que tenga. Document Revised: 04/28/2019 Document Reviewed: 04/28/2019 Elsevier Patient Education   2023 Elsevier Inc.  

## 2021-12-02 NOTE — Progress Notes (Signed)
Subjective:  Patient ID: Melanie Collins, female    DOB: 10/23/1960  Age: 61 y.o. MRN: [id number]  CC: New Patient (Initial Visit)   HPI Jonella Redditt Cornella Emmer is a 61 y.o. year old female with a history of hypertension, hyperlipidemia who presents accompanied by her daughter to establish care.  Interval History: She is currently not on any medication for her hypertension but her chart reveals she has had elevated blood pressure and ambulatory blood pressure readings at home have also been elevated. Her labs had also revealed presence of elevated cholesterol but she is currently not on a statin. She endorses eating healthy, exercising regularly. Denies presence of chest pain or dyspnea and has no additional concerns today.  Past Medical History:  Diagnosis Date   Hyperlipidemia 09/21/2020   Primary hypertension 12/02/2021    Past Surgical History:  Procedure Laterality Date   AUGMENTATION MAMMAPLASTY     NOSE SURGERY      Family History  Problem Relation Age of Onset   Hypertension Mother    Diabetes Mother    Heart disease Father    Cancer Sister        UTERINE   Breast cancer Neg Hx     Social History   Socioeconomic History   Marital status: Married    Spouse name: Not on file   Number of children: 3   Years of education: Not on file   Highest education level: 12th grade  Occupational History   Not on file  Tobacco Use   Smoking status: Never   Smokeless tobacco: Never  Vaping Use   Vaping Use: Never used  Substance and Sexual Activity   Alcohol use: Yes    Comment: OCC   Drug use: No   Sexual activity: Yes    Birth control/protection: Post-menopausal  Other Topics Concern   Not on file  Social History Narrative   Not on file   Social Determinants of Health   Financial Resource Strain: Not on file  Food Insecurity: No Food Insecurity (10/17/2021)   Hunger Vital Sign    Worried About Running Out of Food in the Last Year: Never true     Ran Out of Food in the Last Year: Never true  Transportation Needs: Unmet Transportation Needs (10/17/2021)   PRAPARE - Transportation    Lack of Transportation (Medical): Yes    Lack of Transportation (Non-Medical): Yes  Physical Activity: Sufficiently Active (05/13/2018)   Exercise Vital Sign    Days of Exercise per Week: 3 days    Minutes of Exercise per Session: 60 min  Stress: No Stress Concern Present (05/13/2018)   [place]    Feeling of Stress : Only a little  Social Connections: Somewhat Isolated (05/13/2018)   Social Connection and Isolation Panel [NHANES]    Frequency of Communication with Friends and Family: More than three times a week    Frequency of Social Gatherings with Friends and Family: Once a week    Attends Religious Services: More than 4 times per year    Active Member of [employer] or Organizations: No    Attends Archivist Meetings: Never    Marital Status: Divorced    Allergies  Allergen Reactions   Tramadol Itching    Outpatient Medications Prior to Visit  Medication Sig Dispense Refill   cetirizine (ZYRTEC) 10 MG tablet Take 10 mg by mouth daily.     loratadine (CLARITIN) 10 MG  tablet Take 10 mg by mouth daily.     Multiple Vitamin (MULTIVITAMIN) tablet Take 1 tablet by mouth daily.     No facility-administered medications prior to visit.     ROS Review of Systems  Constitutional:  Negative for activity change and appetite change.  HENT:  Negative for sinus pressure and sore throat.   Respiratory:  Negative for chest tightness, shortness of breath and wheezing.   Cardiovascular:  Negative for chest pain and palpitations.  Gastrointestinal:  Negative for abdominal distention, abdominal pain and constipation.  Genitourinary: Negative.   Musculoskeletal: Negative.   Psychiatric/Behavioral:  Negative for behavioral problems and dysphoric mood.     Objective:  BP (!) 144/72    Pulse 71   Temp 98.3 F (36.8 C) (Oral)   Ht _0  (1.549 m)   Wt 118 lb (53.5 kg)   LMP 07/06/2011   SpO2 99%   BMI 22.30 kg/m      12/02/2021    2:50 PM 10/17/2021    3:01 PM 10/16/2021    9:26 AM  BP/Weight  Systolic BP 774 128 786  Diastolic BP 72 98 90  Wt. (Lbs) 118 114   BMI 22.3 kg/m2 21.54 kg/m2       Physical Exam Constitutional:      Appearance: She is well-developed.  Cardiovascular:     Rate and Rhythm: Normal rate.     Heart sounds: Normal heart sounds. No murmur heard. Pulmonary:     Effort: Pulmonary effort is normal.     Breath sounds: Normal breath sounds. No wheezing or rales.  Chest:     Chest wall: No tenderness.  Abdominal:     General: Bowel sounds are normal. There is no distension.     Palpations: Abdomen is soft. There is no mass.     Tenderness: There is no abdominal tenderness.  Musculoskeletal:        General: Normal range of motion.     Right lower leg: No edema.     Left lower leg: No edema.  Neurological:     Mental Status: She is alert and oriented to person, place, and time.  Psychiatric:        Mood and Affect: Mood normal.        Latest Ref Rng & Units 10/16/2021   10:48 AM 08/27/2020    9:18 AM 07/05/2013    9:47 AM  CMP  Glucose 70 - 99 mg/dL 103  101  84   BUN 6 - 23 mg/dL   20   Creatinine 0.50 - 1.10 mg/dL   0.57   Sodium 135 - 145 mEq/L   138   Potassium 3.5 - 5.3 mEq/L   4.0   Chloride 96 - 112 mEq/L   103   CO2 19 - 32 mEq/L   27   Calcium 8.4 - 10.5 mg/dL   9.6   Total Protein 6.0 - 8.3 g/dL   6.8   Total Bilirubin 0.2 - 1.2 mg/dL   0.5   Alkaline Phos 39 - 117 U/L   63   AST 0 - 37 U/L   24   ALT 0 - 35 U/L   20     Lipid Panel     Component Value Date/Time   CHOL 213 (H) 10/16/2021 1048   TRIG 151 (H) 10/16/2021 1048   HDL 53 10/16/2021 1048   CHOLHDL 4.0 10/16/2021 1048   LDLCALC 133 (H) 10/16/2021 1048    CBC  Component Value Date/Time   WBC 7.1 07/05/2013 0947   RBC 4.36 07/05/2013  0947   HGB 13.9 07/05/2013 0947   HCT 40.9 07/05/2013 0947   PLT 246 07/05/2013 0947   MCV 93.8 07/05/2013 0947   MCH 31.9 07/05/2013 0947   MCHC 34.0 07/05/2013 0947   RDW 13.0 07/05/2013 0947   LYMPHSABS 1.6 07/05/2013 0947   MONOABS 0.6 07/05/2013 0947   EOSABS 0.1 07/05/2013 0947   BASOSABS 0.0 07/05/2013 0947    Lab Results  Component Value Date   HGBA1C 5.6 10/16/2021    The 10-year ASCVD risk score (Arnett DK, et al., 2019) is: 5%   Values used to calculate the score:     Age: 59 years     Sex: Female     Is Non-Hispanic African American: No     Diabetic: No     Tobacco smoker: No     Systolic Blood Pressure: 865 mmHg     Is BP treated: Yes     HDL Cholesterol: 53 mg/dL     Total Cholesterol: 213 mg/dL  Assessment & Plan:  1. Primary hypertension New diagnosis We will initiate losartan and check labs today and again in 2 weeks Counseled on blood pressure goal of less than 130/80, low-sodium, DASH diet, medication compliance, 150 minutes of moderate intensity exercise per week. Discussed medication compliance, adverse effects.  - CMP14+EGFR - losartan (COZAAR) 25 MG tablet; Take 1 tablet (25 mg total) by mouth daily.  Dispense: 90 tablet; Refill: 1 - Basic Metabolic Panel; Future  2. Screening for viral disease - HIV Antibody (routine testing w rflx)  3. Hyperlipidemia, unspecified hyperlipidemia type 10-year ASCVD risk score of 5% We will check lipid panel at next visit and determine if treatment is indicated based on 10-year ASCVD risk score Low-cholesterol diet  4. Need for immunization against influenza - Flu Vaccine QUAD 67moIM (Fluarix, Fluzone & Alfiuria Quad PF)   Health Care Maintenance: Up-to-date on screenings for breast, colon, cervical cancer.  We will discuss needed vaccines at next visit Meds ordered this encounter  Medications   losartan (COZAAR) 25 MG tablet    Sig: Take 1 tablet (25 mg total) by mouth daily.    Dispense:  90 tablet     Refill:  1    Follow-up: Return in about 3 months (around 03/04/2022) for Chronic medical conditions.       ECharlott Rakes MD, FAAFP. [facility] W[facility] [place] [place]  12/02/2021, 5:50 PM

## 2021-12-03 LAB — CMP14+EGFR
ALT: 18 IU/L (ref 0–32)
AST: 22 IU/L (ref 0–40)
Albumin/Globulin Ratio: 2.3 — ABNORMAL HIGH (ref 1.2–2.2)
Albumin: 4.8 g/dL (ref 3.8–4.9)
Alkaline Phosphatase: 81 IU/L (ref 44–121)
BUN/Creatinine Ratio: 31 — ABNORMAL HIGH (ref 9–23)
BUN: 24 mg/dL (ref 6–24)
Bilirubin Total: <0.2 mg/dL (ref 0.0–1.2)
CO2: 23 mmol/L (ref 20–29)
Calcium: 9.7 mg/dL (ref 8.7–10.2)
Chloride: 104 mmol/L (ref 96–106)
Creatinine, Ser: 0.77 mg/dL (ref 0.57–1.00)
Globulin, Total: 2.1 g/dL (ref 1.5–4.5)
Glucose: 88 mg/dL (ref 70–99)
Potassium: 4.3 mmol/L (ref 3.5–5.2)
Sodium: 142 mmol/L (ref 134–144)
Total Protein: 6.9 g/dL (ref 6.0–8.5)
eGFR: 89 mL/min/{1.73_m2} (ref >59)

## 2021-12-03 LAB — HIV ANTIBODY (ROUTINE TESTING W REFLEX): HIV Screen 4th Generation wRfx: NONREACTIVE

## 2022-01-02 ENCOUNTER — Telehealth: Payer: Self-pay

## 2022-01-02 NOTE — Telephone Encounter (Signed)
Health Coaching 3  interpreter- Rudene Anda, [facility]   Goals- Patient has started BP lowering medication since initial screenings. Patients at home BP readings have been lower since starting medication.   New goal- Patient has not been exercising as much recently. Patient contributes this to the loss of her mother. Offered to refer patient for counseling services but patient declined at this time. Patient will try and start exercising for 30 minutes daily.  Barrier to reaching goal-    Strategies to overcome-    Navigation:  Patient is aware of  a follow up session on 02/19/22 @ 1:00 pm   Time-  10 minutes

## 2022-02-19 ENCOUNTER — Inpatient Hospital Stay: Payer: Self-pay | Attending: Obstetrics and Gynecology | Admitting: *Deleted

## 2022-02-19 VITALS — BP 149/79 | Ht 61.0 in | Wt 120.9 lb

## 2022-02-19 DIAGNOSIS — Z0000 Encounter for general adult medical examination without abnormal findings: Secondary | ICD-10-CM

## 2022-02-19 NOTE — Progress Notes (Signed)
[facility] follow up   Interpreter: [name], Haroldine Laws  Clinical Measurement:   Vitals:   02/19/22 1519 02/19/22 1520  BP: 122/76 (!) 149/79      Medical History:  Patient states that she has high cholesterol,  does not know if she has  high blood pressure and she does not have diabetes.  Medications:  Patient states that she does take medication to lower cholesterol, blood pressure and blood sugar.  Patient does not take an aspirin a day to help prevent a heart attack or stroke. During the past 7 days patient has taken prescribed medication to lower blood pressure on 7 days.   Blood pressure, self measurement: Patient states that she does measure blood pressure from home. She checks her blood pressure daily. She shares her readings with a health care provider: no.   Nutrition: Patient states that on average she eats 2 cups of fruit and 2 cups of vegetables per day. Patient states that she does eat fish at least 2 times per week. Patient eats more than half servings of whole grains. Patient drinks less than 36 ounces of beverages with added sugar weekly: yes. Patient is currently watching sodium or salt intake: yes. In the past 7 days patient has had 0 drinks containing alcohol. On average patient drinks 0 drinks containing alcohol per day.      Physical activity:  Patient states that she gets 70 minutes of moderate and 70 minutes of vigorous physical activity each week.  Smoking status:  Patient states that she has has never smoked .   Quality of life:  Over the past 2 weeks patient states that she had little interest or pleasure in doing things: not at all. She has been feeling down, depressed or hopeless:not at all.    Risk reduction and counseling:   Health Coaching: Spoke in detail with patient about blood pressure. Patient brought wrist monitor from home. Check BP using patient's wrist monitor reading was 186/114. Manual reading was lower at 122/76. Gave patient BP monitor to take  home with her to use. Checked BP with automatic monitor and got 149/79. Offered to refer patient to [facility] to follow-up with PCP. Patient declined referral at this time. Encouraged patient to continue checking blood pressure at home with monitor that I gave her and if she gets continuous readings of >140/90 to follow-up with PCP. Also encouraged patient to continue watching the amount of salt that she consumes as well as continuing with daily exercise. Reminded patient to continue taking BP medication daily and to try and take it at the same time everyday. Patient voiced understanding.    Navigation: This was the  follow up session for this patient, I will check up on her progress in the coming months.  Time: 25 minutes

## 2022-11-07 ENCOUNTER — Other Ambulatory Visit: Payer: Self-pay | Admitting: Obstetrics and Gynecology

## 2022-11-07 DIAGNOSIS — Z1231 Encounter for screening mammogram for malignant neoplasm of breast: Secondary | ICD-10-CM

## 2022-12-11 IMAGING — MG DIGITAL SCREENING BREAST BILAT IMPLANT W/ TOMO W/ CAD
8 of 12 series · 8 of 28 positions shown · non-contrast
Comparison: Previous exam(s).

CLINICAL DATA: Screening.

EXAM:
DIGITAL SCREENING BILATERAL MAMMOGRAM WITH IMPLANTS, CAD AND
TOMOSYNTHESIS
TECHNIQUE: Bilateral screening digital craniocaudal and mediolateral oblique
mammograms were obtained. Bilateral screening digital breast
tomosynthesis was performed. The images were evaluated with
computer-aided detection. Standard and/or implant displaced views
were performed.

[L MLO]
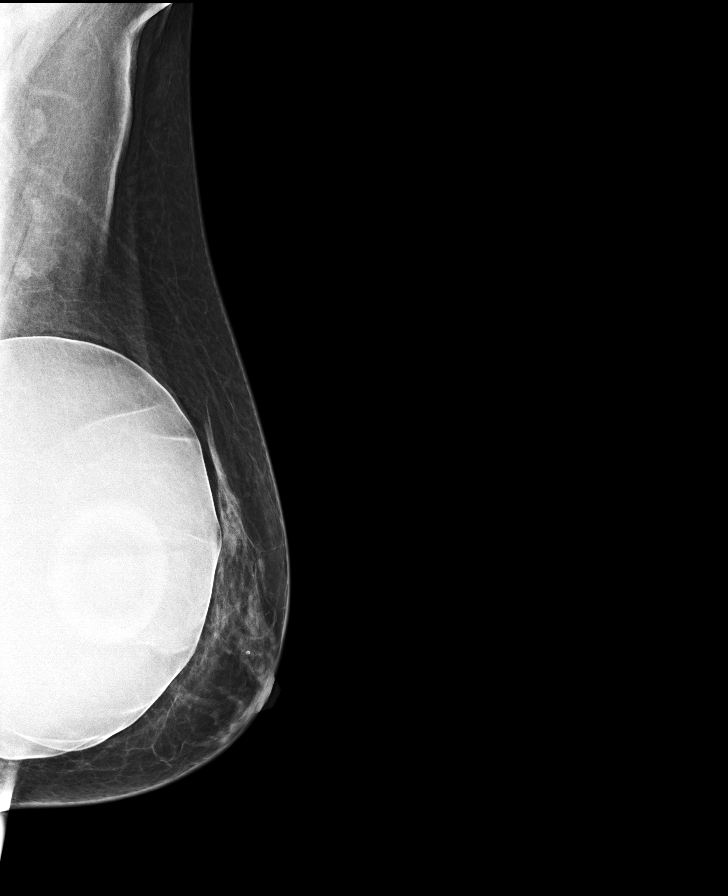

[R CC]
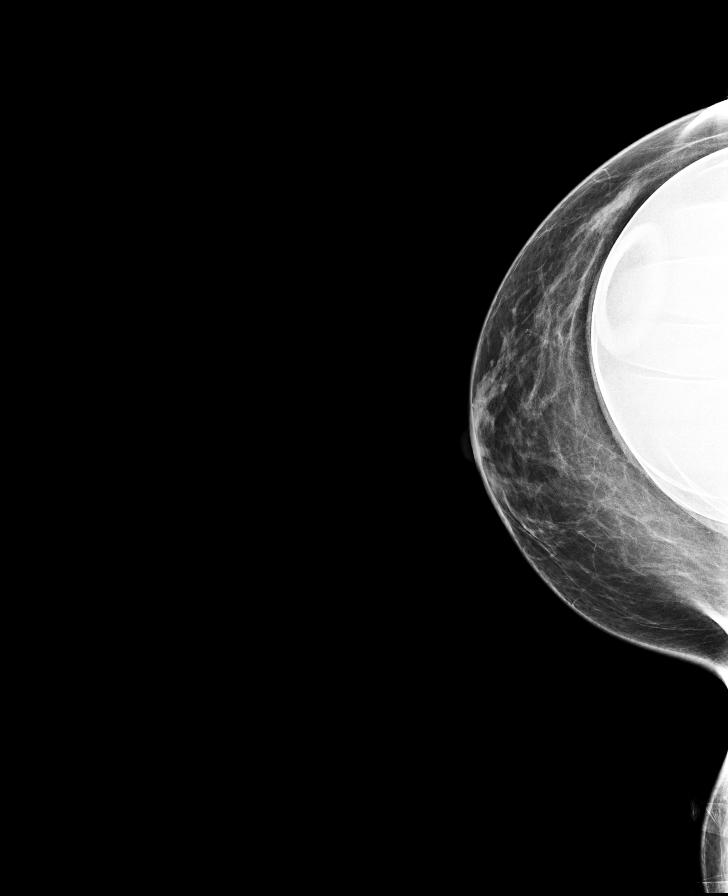

[R MLO]
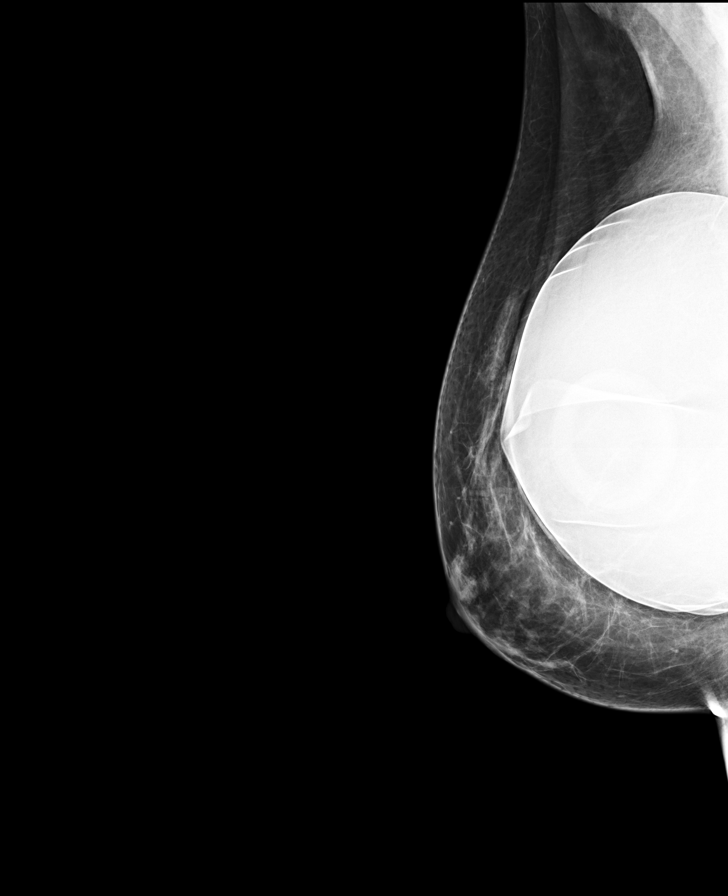

[L CC]
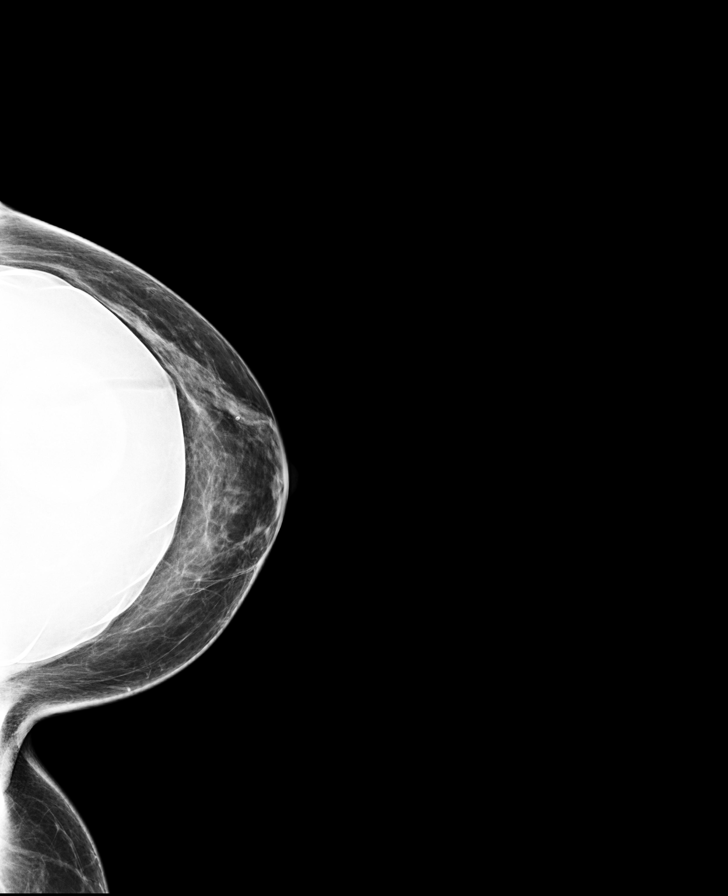

[R CC synth-2D]
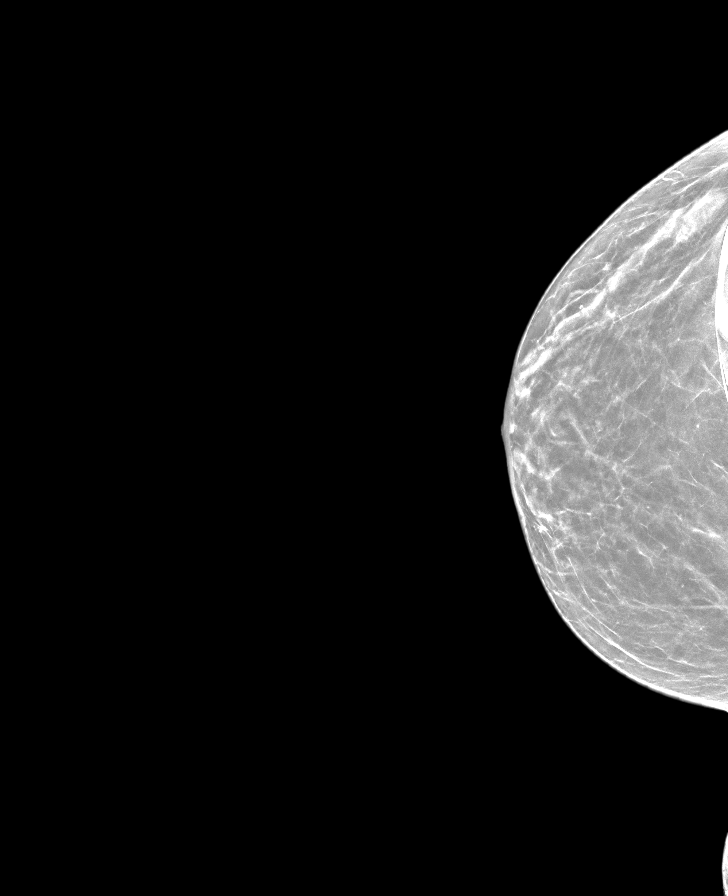

[L CC synth-2D]
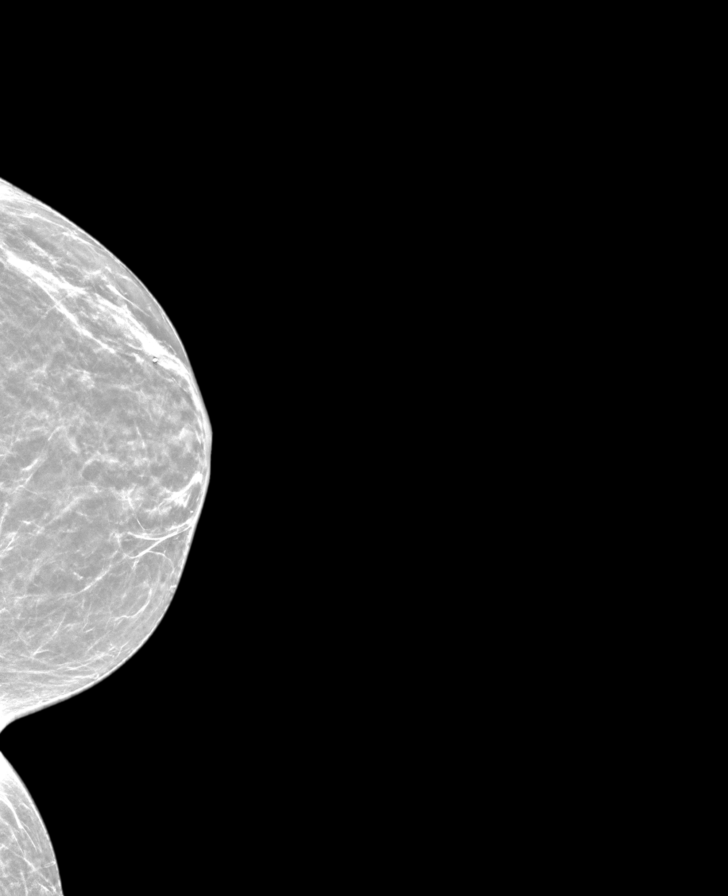

[L MLO synth-2D]
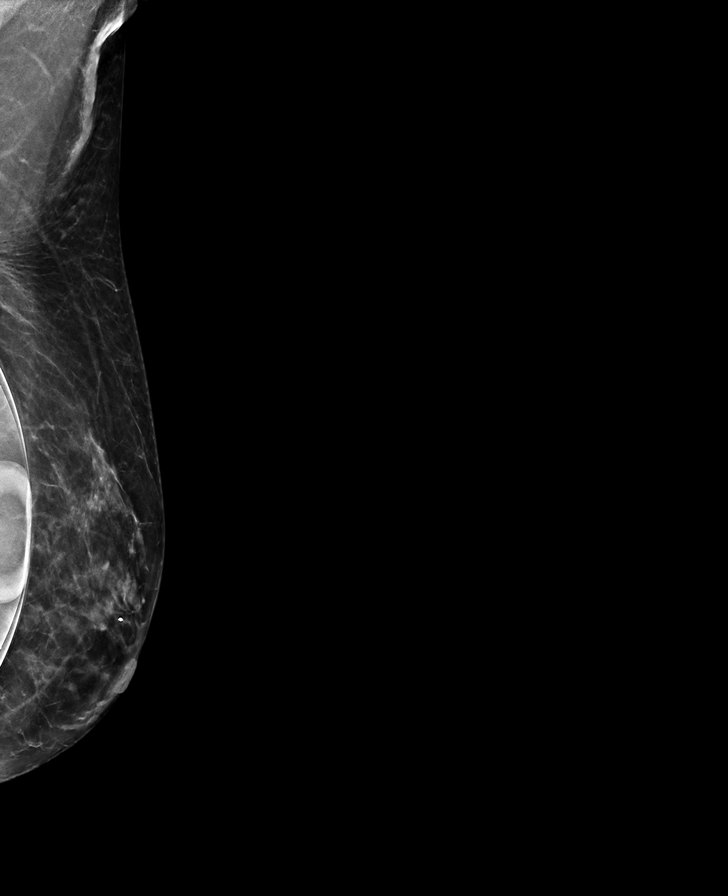

[R MLO synth-2D]
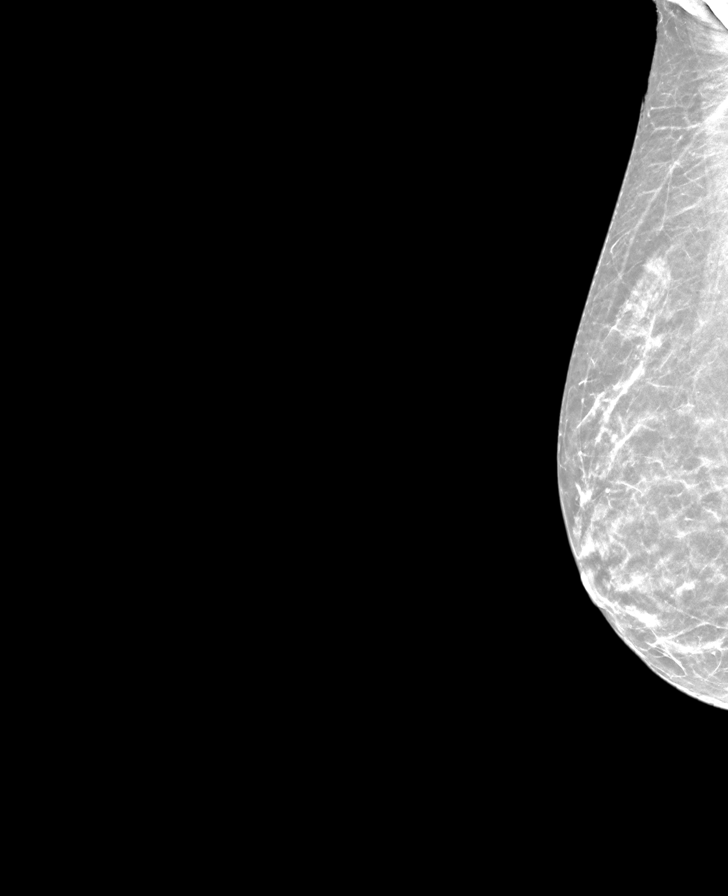

[8 of 28 positions shown; findings below may reference images not displayed]

ACR Breast Density Category b: There are scattered areas of
fibroglandular density.
FINDINGS: The patient has retropectoral implants. There are no findings
suspicious for malignancy.
IMPRESSION: No mammographic evidence of malignancy. A result letter of this
screening mammogram will be mailed directly to the patient.

RECOMMENDATION:
Screening mammogram in one year. (Code:SE-S-JMG)

BI-RADS CATEGORY  1:  Negative.

## 2022-12-18 ENCOUNTER — Other Ambulatory Visit: Payer: Self-pay | Admitting: Obstetrics and Gynecology

## 2022-12-18 ENCOUNTER — Ambulatory Visit
Admission: RE | Admit: 2022-12-18 | Discharge: 2022-12-18 | Disposition: A | Discharge status: Home / Self Care | Payer: No Typology Code available for payment source | Source: Ambulatory Visit | Attending: Obstetrics and Gynecology | Admitting: Obstetrics and Gynecology

## 2022-12-18 ENCOUNTER — Ambulatory Visit: Payer: Self-pay | Admitting: Hematology and Oncology

## 2022-12-18 VITALS — BP 140/83 | Wt 124.0 lb

## 2022-12-18 DIAGNOSIS — Z1231 Encounter for screening mammogram for malignant neoplasm of breast: Secondary | ICD-10-CM

## 2022-12-18 DIAGNOSIS — Z1211 Encounter for screening for malignant neoplasm of colon: Secondary | ICD-10-CM

## 2022-12-18 NOTE — Progress Notes (Signed)
[name] Kaidynce Spyker is a 62 y.o. female who presents to [facility] clinic today with no complaints.    Pap Smear: Pap not smear completed today. Last Pap smear was 07/19/20 and was normal. Per patient has no history of an abnormal Pap smear. Last Pap smear result is not available in Epic.   Physical exam: Breasts Breasts symmetrical. No skin abnormalities bilateral breasts. No nipple retraction bilateral breasts. No nipple discharge bilateral breasts. No lymphadenopathy. No lumps palpated bilateral breasts. MS DIGITAL SCREENING TOMO W/IMPLANTS BILAT  Result Date: 10/18/2021 CLINICAL DATA:  Screening. EXAM: DIGITAL SCREENING BILATERAL MAMMOGRAM WITH IMPLANTS, CAD AND TOMOSYNTHESIS TECHNIQUE: Bilateral screening digital craniocaudal and mediolateral oblique mammograms were obtained. Bilateral screening digital breast tomosynthesis was performed. The images were evaluated with computer-aided detection. Standard and/or implant displaced views were performed. COMPARISON:  Previous exam(s). ACR Breast Density Category b: There are scattered areas of fibroglandular density. FINDINGS: The patient has retropectoral implants. There are no findings suspicious for malignancy. IMPRESSION: No mammographic evidence of malignancy. A result letter of this screening mammogram will be mailed directly to the patient. RECOMMENDATION: Screening mammogram in one year. (Code:SM-B-01Y) BI-RADS CATEGORY  1: Negative. Electronically Signed   By: Ted Mcalpine M.D.   On: 10/18/2021 14:22   MS DIGITAL SCREENING TOMO W/IMPLANTS BILAT  Result Date: 07/20/2020 CLINICAL DATA:  Screening. EXAM: DIGITAL SCREENING BILATERAL MAMMOGRAM WITH IMPLANTS, CAD AND TOMOSYNTHESIS TECHNIQUE: Bilateral screening digital craniocaudal and mediolateral oblique mammograms were obtained. Bilateral screening digital breast tomosynthesis was performed. The images were evaluated with computer-aided detection. Standard and/or implant displaced  views were performed. COMPARISON:  Previous exam(s). ACR Breast Density Category b: There are scattered areas of fibroglandular density. FINDINGS: The patient has retropectoral implants. There are no findings suspicious for malignancy. IMPRESSION: No mammographic evidence of malignancy. A result letter of this screening mammogram will be mailed directly to the patient. RECOMMENDATION: Screening mammogram in one year. (Code:SM-B-01Y) BI-RADS CATEGORY  1:  Negative. Electronically Signed   By: Harmon Pier M.D.   On: 07/20/2020 07:58   MS DIGITAL SCREENING TOMO W/IMPLANTS BILAT  Result Date: 07/15/2019 CLINICAL DATA:  Screening. EXAM: DIGITAL SCREENING BILATERAL MAMMOGRAM WITH IMPLANTS, CAD AND TOMO The patient has retropectoral implants. Standard and implant displaced views were performed. COMPARISON:  Previous exam(s). ACR Breast Density Category b: There are scattered areas of fibroglandular density. FINDINGS: There are no findings suspicious for malignancy. Images were processed with CAD. IMPRESSION: No mammographic evidence of malignancy. A result letter of this screening mammogram will be mailed directly to the patient. RECOMMENDATION: Screening mammogram in one year. (Code:SM-B-01Y) BI-RADS CATEGORY  1:  Negative. Electronically Signed   By: Amie Portland M.D.   On: 07/15/2019 13:16          Pelvic/Bimanual Pap is not indicated today    Smoking History: Patient has never smoked and was not referred to quit line.    Patient Navigation: Patient education provided. Access to services provided for patient through [facility] program. [name] interpreter provided. No transportation provided   Colorectal Cancer Screening: Per patient has never had colonoscopy completed No complaints today. FIT test given.    Breast and Cervical Cancer Risk Assessment: Patient does not have family history of breast cancer, known genetic mutations, or radiation treatment to the chest before age 31. Patient does not  have history of cervical dysplasia, immunocompromised, or DES exposure in-utero.  Risk Scores as of Encounter on 12/18/2022     Melanie Collins  5-year 0.59%   Lifetime 3.41%            Last calculated by Caprice Red, CMA on 12/18/2022 at  8:31 AM          A: [facility] exam without pap smear No complaints with benign exam.   P: Referred patient to the [facility] for a screening mammogram. Appointment scheduled 12/18/22.  Melanie Collins A, NP 12/18/2022 8:15 AM

## 2022-12-18 NOTE — Patient Instructions (Signed)
Taught Melanie Collins Melanie Collins about self breast awareness and gave educational materials to take home. Patient did not need a Pap smear today due to last Pap smear was in 07/19/20 per patient.  Let her know [facility] will cover Pap smears every 5 years unless has a history of abnormal Pap smears. Referred patient to the [facility] for screening mammogram. Appointment scheduled for 12/18/22. Patient aware of appointment and will be there. Let patient know will follow up with her within the next couple weeks with results. Melanie Collins Melanie Melanie Collins verbalized understanding.  Pascal Lux, NP 8:17 AM

## 2022-12-26 LAB — FECAL OCCULT BLOOD, IMMUNOCHEMICAL: Fecal Occult Bld: NEGATIVE

## 2023-09-14 ENCOUNTER — Other Ambulatory Visit (HOSPITAL_COMMUNITY): Payer: Self-pay | Admitting: Obstetrics & Gynecology

## 2023-09-14 DIAGNOSIS — R1909 Other intra-abdominal and pelvic swelling, mass and lump: Secondary | ICD-10-CM

## 2023-10-02 ENCOUNTER — Ambulatory Visit (HOSPITAL_BASED_OUTPATIENT_CLINIC_OR_DEPARTMENT_OTHER)
Admission: RE | Admit: 2023-10-02 | Discharge: 2023-10-02 | Disposition: A | Discharge status: Home / Self Care | Payer: Self-pay | Source: Ambulatory Visit | Attending: Obstetrics & Gynecology | Admitting: Obstetrics & Gynecology

## 2023-10-02 DIAGNOSIS — R1909 Other intra-abdominal and pelvic swelling, mass and lump: Secondary | ICD-10-CM | POA: Insufficient documentation

## 2023-10-02 LAB — POCT I-STAT CREATININE: Creatinine, Ser: 1.3 mg/dL — ABNORMAL HIGH (ref 0.44–1.00)

## 2023-10-02 MED ORDER — IOHEXOL 300 MG/ML  SOLN
100.0000 mL | Freq: Once | INTRAMUSCULAR | Status: AC | PRN
  Administered 2023-10-02: 100 mL via INTRAVENOUS

## 2023-10-16 NOTE — Progress Notes (Signed)
 REFERRING PHYSICIAN:  Self  PROVIDER:  LYNDA LEOS, MD  MRN: [id number] DOB: 1961/01/24 DATE OF ENCOUNTER: 10/16/2023  Subjective   Chief Complaint: New Consultation     History of Present Illness: Melanie Collins is a 63 y.o. female who is seen today as an office consultation at the request of Dr. Arch for evaluation of New Consultation .   History of Present Illness Melanie Collins is a 63 year old female who presents with worsening pain and swelling due to bilateral hernias.  She initially noticed discomfort while walking, which progressed to a small lump that has increased in size. Swelling is now present on both sides, with the left side more prominent. Pain is intermittent, worsening with prolonged standing or physical exertion, and swelling reduces when lying down. A CT scan in the emergency room confirmed bilateral hernias. She takes medication for hypertension and has no history of abdominal surgeries. She occasionally works in Human resources officer, involving minimal heavy lifting. No other symptoms are present.    Review of Systems: A complete review of systems was obtained from the patient.  I have reviewed this information and discussed as appropriate with the patient.  See HPI as well for other ROS.  Review of Systems  Constitutional:  Negative for fever.  HENT:  Negative for congestion.   Eyes:  Negative for blurred vision.  Respiratory:  Negative for cough, shortness of breath and wheezing.   Cardiovascular:  Negative for chest pain and palpitations.  Gastrointestinal:  Negative for heartburn.  Genitourinary:  Negative for dysuria.  Musculoskeletal:  Negative for myalgias.  Skin:  Negative for rash.  Neurological:  Negative for dizziness and headaches.  Psychiatric/Behavioral:  Negative for depression and suicidal ideas.   All other systems reviewed and are negative.     Medical History: Past Medical History:  Diagnosis Date  . Hypertension     There is no  problem list on file for this patient.   Past Surgical History:  Procedure Laterality Date  . AUGMENTATION MAMMAPLASTY    . nose surgery       Allergies  Allergen Reactions  . Tramadol Hives and Itching    Current Outpatient Medications on File Prior to Visit  Medication Sig Dispense Refill  . loratadine (CLARITIN) 10 mg tablet Take 10 mg by mouth once daily     No current facility-administered medications on file prior to visit.    Family History  Problem Relation Age of Onset  . Diabetes Mother   . High blood pressure (Hypertension) Mother   . Heart disease Father   . Uterine cancer Sister      Social History   Tobacco Use  Smoking Status Never  Smokeless Tobacco Never     Social History   Socioeconomic History  . Marital status: Married  Tobacco Use  . Smoking status: Never  . Smokeless tobacco: Never  Substance and Sexual Activity  . Alcohol use: Never  . Drug use: Never   Social Drivers of Health   Food Insecurity: No Food Insecurity (12/18/2022)   Received from [facility]   Hunger Vital Sign   . Within the past 12 months, you worried that your food would run out before you got the money to buy more.: Never true   . Within the past 12 months, the food you bought just didn't last and you didn't have money to get more.: Never true  Transportation Needs: No Transportation Needs (12/18/2022)   Received from [facility]  PRAPARE - Transportation   . Lack of Transportation (Medical): No   . Lack of Transportation (Non-Medical): No  Physical Activity: Sufficiently Active (05/13/2018)   Received from [facility]   Exercise Vital Sign   . Days of Exercise per Week: 3 days   . Minutes of Exercise per Session: 60 min  Stress: No Stress Concern Present (05/13/2018)   Received from [facility] of Occupational Health - Occupational Stress Questionnaire   . Feeling of Stress : Only a little  Social Connections: Somewhat Isolated (05/13/2018)    Received from [facility]   Social Connection and Isolation Panel   . Frequency of Communication with Friends and Family: More than three times a week   . Frequency of Social Gatherings with Friends and Family: Once a week   . Attends Religious Services: More than 4 times per year   . Active Member of Clubs or Organizations: No   . Attends Banker Meetings: Never   . Marital Status: Divorced  Housing Stability: Unknown (10/16/2023)   Housing Stability Vital Sign   . Homeless in the Last Year: No    Objective:    Vitals:   10/16/23 1108  BP: 138/82  Pulse: 73  Temp: 36.9 C (98.5 F)  SpO2: 99%  Weight: 57.5 kg (126 lb 12.8 oz)  Height: 154.9 cm (5' 1)  PainSc: 0-No pain  PainLoc: Abdomen    Body mass index is 23.96 kg/m. Physical Exam Constitutional:      Appearance: Normal appearance.  HENT:     Head: Normocephalic and atraumatic.     Mouth/Throat:     Mouth: Mucous membranes are moist.     Pharynx: Oropharynx is clear.   Eyes:     General: No scleral icterus.    Pupils: Pupils are equal, round, and reactive to light.    Cardiovascular:     Rate and Rhythm: Normal rate and regular rhythm.     Pulses: Normal pulses.     Heart sounds: No murmur heard.    No friction rub. No gallop.  Pulmonary:     Effort: Pulmonary effort is normal. No respiratory distress.     Breath sounds: Normal breath sounds. No stridor.  Abdominal:     General: Abdomen is flat.     Hernia: A hernia is present. Hernia is present in the left inguinal area and right inguinal area.   Musculoskeletal:        General: No swelling.   Skin:    General: Skin is warm.   Neurological:     General: No focal deficit present.     Mental Status: She is alert and oriented to person, place, and time. Mental status is at baseline.   Psychiatric:        Mood and Affect: Mood normal.        Thought Content: Thought content normal.        Judgment: Judgment normal.        Assessment and Plan:  Diagnoses and all orders for this visit:  Bilateral inguinal hernia without obstruction or gangrene, recurrence not specified    Melanie Collins is a 63 y.o. female    We will proceed to the OR for a lap bilateral inguinal hernia repair with mesh. All risks and benefits were discussed with the patient, to generally include infection, bleeding, damage to surrounding structures, acute and chronic nerve pain, and recurrence. Alternatives were offered and described.  All questions were  answered and the patient voiced understanding of the procedure and wishes to proceed at this point.       No follow-ups on file.  Lynda Leos, MD, [facility], [place] [facility] Minimally Invasive Surgery

## 2023-10-22 LAB — COLOGUARD: COLOGUARD: NEGATIVE

## 2023-12-02 ENCOUNTER — Other Ambulatory Visit: Payer: Self-pay

## 2023-12-02 ENCOUNTER — Encounter (HOSPITAL_COMMUNITY): Payer: Self-pay | Admitting: General Surgery

## 2023-12-02 NOTE — Progress Notes (Signed)
 SDW call  Patient and her daughter Suan were given pre-op instructions over the phone. They verbalized understanding of instructions provided. [employer] interpreter, they refused. They denied any SOB, fever or cough     PCP - denied, states she get her medication from a family friend Cardiologist -  Pulmonary:    PPM/ICD - denies Device Orders -na Rep Notified - na   Chest x-ray - na EKG -  DOS, 12/03/2023 Stress Test - ECHO -  Cardiac Cath -   Sleep Study/sleep apnea/CPAP: denies  Non-diabetic   Blood Thinner Instructions: denies Aspirin Instructions:denies   ERAS Protcol - Clears until 0600   Anesthesia review: No  Your procedure is scheduled on Thursday December 03, 2023  Report to [facility] Main Entrance A at  0630  A.M., then check in with the Admitting office.  Call this number if you have problems the morning of surgery:  [phone]   If you have any questions prior to your surgery date call [phone]: Open Monday-Friday 8am-4pm If you experience any cold or flu symptoms such as cough, fever, chills, shortness of breath, etc. between now and your scheduled surgery, please notify us  at the above number     Remember:  Do not eat after midnight the night before your surgery  You may drink clear liquids until  0600   the morning of your surgery.   Clear liquids allowed are: Water, Non-Citrus Juices (without pulp), Carbonated Beverages, Clear Tea, Black Coffee ONLY (NO MILK, CREAM OR POWDERED CREAMER of any kind), and Gatorade   Take these medicines the morning of surgery with A SIP OF WATER:  None  As of today, STOP taking any Aspirin (unless otherwise instructed by your surgeon) Aleve, Naproxen, Ibuprofen, Motrin, Advil, Goody's, BC's, all herbal medications, fish oil, and all vitamins.

## 2023-12-02 NOTE — H&P (Signed)
 Chief Complaint: New Consultation       History of Present Illness: Melanie Collins is a 63 y.o. female who is seen today as an office consultation at the request of Dr. Arch for evaluation of New Consultation .   History of Present Illness Melanie Collins is a 63 year old female who presents with worsening pain and swelling due to bilateral hernias.   She initially noticed discomfort while walking, which progressed to a small lump that has increased in size. Swelling is now present on both sides, with the left side more prominent. Pain is intermittent, worsening with prolonged standing or physical exertion, and swelling reduces when lying down. A CT scan in the emergency room confirmed bilateral hernias. She takes medication for hypertension and has no history of abdominal surgeries. She occasionally works in Human resources officer, involving minimal heavy lifting. No other symptoms are present.       Review of Systems: A complete review of systems was obtained from the patient.  I have reviewed this information and discussed as appropriate with the patient.  See HPI as well for other ROS.   Review of Systems  Constitutional:  Negative for fever.  HENT:  Negative for congestion.   Eyes:  Negative for blurred vision.  Respiratory:  Negative for cough, shortness of breath and wheezing.   Cardiovascular:  Negative for chest pain and palpitations.  Gastrointestinal:  Negative for heartburn.  Genitourinary:  Negative for dysuria.  Musculoskeletal:  Negative for myalgias.  Skin:  Negative for rash.  Neurological:  Negative for dizziness and headaches.  Psychiatric/Behavioral:  Negative for depression and suicidal ideas.   All other systems reviewed and are negative.       Medical History:     Past Medical History:  Diagnosis Date   Hypertension        There is no problem list on file for this patient.          Past Surgical History:  Procedure Laterality Date   AUGMENTATION MAMMAPLASTY        nose surgery              Allergies  Allergen Reactions   Tramadol Hives and Itching            Current Outpatient Medications on File Prior to Visit  Medication Sig Dispense Refill   loratadine (CLARITIN) 10 mg tablet Take 10 mg by mouth once daily        No current facility-administered medications on file prior to visit.           Family History  Problem Relation Age of Onset   Diabetes Mother     High blood pressure (Hypertension) Mother     Heart disease Father     Uterine cancer Sister        Social History       Tobacco Use  Smoking Status Never  Smokeless Tobacco Never      Social History        Socioeconomic History   Marital status: Married  Tobacco Use   Smoking status: Never   Smokeless tobacco: Never  Substance and Sexual Activity   Alcohol use: Never   Drug use: Never    Social Drivers of Health        Food Insecurity: No Food Insecurity (12/18/2022)    Received from [facility]    Hunger Vital Sign     Within the past 12 months, you worried that your food would run out  before you got the money to buy more.: Never true     Within the past 12 months, the food you bought just didn't last and you didn't have money to get more.: Never true  Transportation Needs: No Transportation Needs (12/18/2022)    Received from [facility] - Transportation     Lack of Transportation (Medical): No     Lack of Transportation (Non-Medical): No  Physical Activity: Sufficiently Active (05/13/2018)    Received from [facility]    Exercise Vital Sign     Days of Exercise per Week: 3 days     Minutes of Exercise per Session: 60 min  Stress: No Stress Concern Present (05/13/2018)    Received from [facility] of Occupational Health - Occupational Stress Questionnaire     Feeling of Stress : Only a little  Social Connections: Somewhat Isolated (05/13/2018)    Received from [facility]    Social Connection and Isolation Panel      Frequency of Communication with Friends and Family: More than three times a week     Frequency of Social Gatherings with Friends and Family: Once a week     Attends Religious Services: More than 4 times per year     Active Member of [employer] or Organizations: No     Attends Banker Meetings: Never     Marital Status: Divorced  Housing Stability: Unknown (10/16/2023)    Housing Stability Vital Sign     Homeless in the Last Year: No      Objective:         Vitals:    10/16/23 1108  BP: 138/82  Pulse: 73  Temp: 36.9 C (98.5 F)  SpO2: 99%  Weight: 57.5 kg (126 lb 12.8 oz)  Height: 154.9 cm (5' 1)  PainSc: 0-No pain  PainLoc: Abdomen    Body mass index is 23.96 kg/m. Physical Exam Constitutional:      Appearance: Normal appearance.  HENT:     Head: Normocephalic and atraumatic.     Mouth/Throat:     Mouth: Mucous membranes are moist.     Pharynx: Oropharynx is clear.    Eyes:     General: No scleral icterus.    Pupils: Pupils are equal, round, and reactive to light.      Cardiovascular:     Rate and Rhythm: Normal rate and regular rhythm.     Pulses: Normal pulses.     Heart sounds: No murmur heard.    No friction rub. No gallop.  Pulmonary:     Effort: Pulmonary effort is normal. No respiratory distress.     Breath sounds: Normal breath sounds. No stridor.  Abdominal:     General: Abdomen is flat.     Hernia: A hernia is present. Hernia is present in the left inguinal area and right inguinal area.    Musculoskeletal:        General: No swelling.    Skin:    General: Skin is warm.    Neurological:     General: No focal deficit present.     Mental Status: She is alert and oriented to person, place, and time. Mental status is at baseline.    Psychiatric:        Mood and Affect: Mood normal.        Thought Content: Thought content normal.        Judgment: Judgment normal.  Assessment and Plan:  Diagnoses and all orders for this  visit:   Bilateral inguinal hernia without obstruction or gangrene, recurrence not specified     Melanie Collins is a 63 y.o. female     We will proceed to the OR for a lap bilateral inguinal hernia repair with mesh. All risks and benefits were discussed with the patient, to generally include infection, bleeding, damage to surrounding structures, acute and chronic nerve pain, and recurrence. Alternatives were offered and described.  All questions were answered and the patient voiced understanding of the procedure and wishes to proceed at this point.             No follow-ups on file.   Lynda Leos, MD, [facility], [place] [facility] Minimally Invasive Surgery

## 2023-12-03 ENCOUNTER — Ambulatory Visit (HOSPITAL_COMMUNITY): Admitting: Anesthesiology

## 2023-12-03 ENCOUNTER — Ambulatory Visit (HOSPITAL_BASED_OUTPATIENT_CLINIC_OR_DEPARTMENT_OTHER): Admitting: Anesthesiology

## 2023-12-03 ENCOUNTER — Encounter (HOSPITAL_COMMUNITY)
Admission: RE | Disposition: A | Discharge status: Home / Self Care | Payer: Self-pay | Source: Home / Self Care | Attending: General Surgery

## 2023-12-03 ENCOUNTER — Other Ambulatory Visit: Payer: Self-pay

## 2023-12-03 ENCOUNTER — Encounter (HOSPITAL_COMMUNITY): Payer: Self-pay | Admitting: General Surgery

## 2023-12-03 ENCOUNTER — Ambulatory Visit (HOSPITAL_COMMUNITY)
Admission: RE | Admit: 2023-12-03 | Discharge: 2023-12-03 | Disposition: A | Discharge status: Home / Self Care | Attending: General Surgery | Admitting: General Surgery

## 2023-12-03 DIAGNOSIS — K4020 Bilateral inguinal hernia, without obstruction or gangrene, not specified as recurrent: Secondary | ICD-10-CM

## 2023-12-03 DIAGNOSIS — I10 Essential (primary) hypertension: Secondary | ICD-10-CM | POA: Insufficient documentation

## 2023-12-03 HISTORY — PX: INGUINAL HERNIA REPAIR: SHX194

## 2023-12-03 LAB — BASIC METABOLIC PANEL WITH GFR
Anion gap: 10 (ref 5–15)
BUN: 17 mg/dL (ref 6–20)
CO2: 24 mmol/L (ref 22–32)
Calcium: 9.5 mg/dL (ref 8.9–10.3)
Chloride: 107 mmol/L (ref 98–111)
Creatinine, Ser: 0.76 mg/dL (ref 0.44–1.00)
GFR, Estimated: >60 mL/min (ref >60)
Glucose, Bld: 96 mg/dL (ref 70–99)
Potassium: 4.2 mmol/L (ref 3.5–5.1)
Sodium: 141 mmol/L (ref 135–145)

## 2023-12-03 LAB — CBC
HCT: 41 % (ref 36.0–46.0)
Hemoglobin: 13.7 g/dL (ref 12.0–15.0)
MCH: 31.6 pg (ref 26.0–34.0)
MCHC: 33.4 g/dL (ref 30.0–36.0)
MCV: 94.5 fL (ref 80.0–100.0)
Platelets: 221 K/uL (ref 150–400)
RBC: 4.34 MIL/uL (ref 3.87–5.11)
RDW: 11.9 % (ref 11.5–15.5)
WBC: 5.3 K/uL (ref 4.0–10.5)
nRBC: 0 % (ref 0.0–0.2)

## 2023-12-03 SURGERY — REPAIR, HERNIA, INGUINAL, BILATERAL, LAPAROSCOPIC
Anesthesia: General | Site: Groin | Laterality: Bilateral

## 2023-12-03 MED ORDER — BUPIVACAINE HCL (PF) 0.25 % IJ SOLN
INTRAMUSCULAR | Status: DC | PRN
  Administered 2023-12-03: 8 mL

## 2023-12-03 MED ORDER — SUGAMMADEX SODIUM 200 MG/2ML IV SOLN
INTRAVENOUS | Status: DC | PRN
  Administered 2023-12-03: 200 mg via INTRAVENOUS

## 2023-12-03 MED ORDER — 0.9 % SODIUM CHLORIDE (POUR BTL) OPTIME
TOPICAL | Status: DC | PRN
  Administered 2023-12-03: 1000 mL

## 2023-12-03 MED ORDER — OXYCODONE HCL 5 MG PO TABS: 5.0000 mg | ORAL_TABLET | Freq: Once | ORAL | Status: DC | PRN

## 2023-12-03 MED ORDER — BUPIVACAINE HCL (PF) 0.25 % IJ SOLN
INTRAMUSCULAR | Status: AC
  Filled 2023-12-03: qty 30

## 2023-12-03 MED ORDER — FENTANYL CITRATE (PF) 250 MCG/5ML IJ SOLN
INTRAMUSCULAR | Status: DC | PRN
  Administered 2023-12-03: 100 ug via INTRAVENOUS

## 2023-12-03 MED ORDER — CEFAZOLIN SODIUM-DEXTROSE 2-3 GM-%(50ML) IV SOLR
INTRAVENOUS | Status: DC | PRN
Start: 2023-12-03 — End: 2023-12-03
  Administered 2023-12-03: 2 g via INTRAVENOUS

## 2023-12-03 MED ORDER — LACTATED RINGERS IV SOLN: INTRAVENOUS | Status: DC

## 2023-12-03 MED ORDER — PROPOFOL 10 MG/ML IV BOLUS
INTRAVENOUS | Status: DC | PRN
  Administered 2023-12-03: 150 mg via INTRAVENOUS

## 2023-12-03 MED ORDER — LIDOCAINE 2% (20 MG/ML) 5 ML SYRINGE
INTRAMUSCULAR | Status: DC | PRN
  Administered 2023-12-03: 50 mg via INTRAVENOUS

## 2023-12-03 MED ORDER — ROCURONIUM BROMIDE 10 MG/ML (PF) SYRINGE
PREFILLED_SYRINGE | INTRAVENOUS | Status: DC | PRN
  Administered 2023-12-03: 40 mg via INTRAVENOUS

## 2023-12-03 MED ORDER — MIDAZOLAM HCL 2 MG/2ML IJ SOLN
INTRAMUSCULAR | Status: AC
  Filled 2023-12-03: qty 2

## 2023-12-03 MED ORDER — OXYCODONE HCL 5 MG PO TABS
5.0000 mg | ORAL_TABLET | ORAL | 0 refills | Status: AC | PRN
Start: 2023-12-03 — End: ?

## 2023-12-03 MED ORDER — AMISULPRIDE (ANTIEMETIC) 5 MG/2ML IV SOLN: 10.0000 mg | Freq: Once | INTRAVENOUS | Status: DC | PRN

## 2023-12-03 MED ORDER — ACETAMINOPHEN 500 MG PO TABS
1000.0000 mg | ORAL_TABLET | Freq: Once | ORAL | Status: AC
  Administered 2023-12-03: 1000 mg via ORAL
  Filled 2023-12-03: qty 2

## 2023-12-03 MED ORDER — ONDANSETRON HCL 4 MG/2ML IJ SOLN
INTRAMUSCULAR | Status: DC | PRN
  Administered 2023-12-03: 4 mg via INTRAVENOUS

## 2023-12-03 MED ORDER — LOSARTAN POTASSIUM 25 MG PO TABS: 25.0000 mg | ORAL_TABLET | Freq: Every day | ORAL | 1 refills | Status: AC

## 2023-12-03 MED ORDER — HYDROMORPHONE HCL 1 MG/ML IJ SOLN
0.2500 mg | INTRAMUSCULAR | Status: DC | PRN
  Administered 2023-12-03: 0.25 mg via INTRAVENOUS

## 2023-12-03 MED ORDER — HYDROMORPHONE HCL 1 MG/ML IJ SOLN
INTRAMUSCULAR | Status: AC
  Filled 2023-12-03: qty 1

## 2023-12-03 MED ORDER — MIDAZOLAM HCL 2 MG/2ML IJ SOLN
INTRAMUSCULAR | Status: DC | PRN
  Administered 2023-12-03: 2 mg via INTRAVENOUS

## 2023-12-03 MED ORDER — ORAL CARE MOUTH RINSE: 15.0000 mL | Freq: Once | OROMUCOSAL | Status: AC

## 2023-12-03 MED ORDER — CHLORHEXIDINE GLUCONATE 0.12 % MT SOLN
15.0000 mL | Freq: Once | OROMUCOSAL | Status: AC
  Administered 2023-12-03: 15 mL via OROMUCOSAL
  Filled 2023-12-03: qty 15

## 2023-12-03 MED ORDER — PHENYLEPHRINE 80 MCG/ML (10ML) SYRINGE FOR IV PUSH (FOR BLOOD PRESSURE SUPPORT)
PREFILLED_SYRINGE | INTRAVENOUS | Status: DC | PRN
  Administered 2023-12-03 (×2): 80 ug via INTRAVENOUS
  Administered 2023-12-03: 160 ug via INTRAVENOUS
  Administered 2023-12-03: 80 ug via INTRAVENOUS

## 2023-12-03 MED ORDER — FENTANYL CITRATE (PF) 250 MCG/5ML IJ SOLN
INTRAMUSCULAR | Status: AC
  Filled 2023-12-03: qty 5

## 2023-12-03 MED ORDER — ONDANSETRON HCL 4 MG/2ML IJ SOLN: 4.0000 mg | Freq: Once | INTRAMUSCULAR | Status: DC | PRN

## 2023-12-03 MED ORDER — LACTATED RINGERS IV SOLN
INTRAVENOUS | Status: DC | PRN
Start: 2023-12-03 — End: 2023-12-03

## 2023-12-03 MED ORDER — OXYCODONE HCL 5 MG/5ML PO SOLN: 5.0000 mg | Freq: Once | ORAL | Status: DC | PRN

## 2023-12-03 MED ORDER — DEXAMETHASONE SODIUM PHOSPHATE 10 MG/ML IJ SOLN
INTRAMUSCULAR | Status: DC | PRN
  Administered 2023-12-03: 10 mg via INTRAVENOUS

## 2023-12-03 SURGICAL SUPPLY — 37 items
APPLIER CLIP LOGIC TI 5 (MISCELLANEOUS) IMPLANT
BAG COUNTER SPONGE SURGICOUNT (BAG) ×1 IMPLANT
CANISTER SUCTION 3000ML PPV (SUCTIONS) IMPLANT
CHLORAPREP W/TINT 26 (MISCELLANEOUS) ×1 IMPLANT
COVER SURGICAL LIGHT HANDLE (MISCELLANEOUS) ×1 IMPLANT
DERMABOND ADVANCED .7 DNX12 (GAUZE/BANDAGES/DRESSINGS) ×1 IMPLANT
DISSECTOR BLUNT TIP ENDO 5MM (MISCELLANEOUS) IMPLANT
DRAPE LAPAROSCOPIC ABDOMINAL (DRAPES) ×1 IMPLANT
ELECTRODE REM PT RTRN 9FT ADLT (ELECTROSURGICAL) ×1 IMPLANT
ENDOLOOP SUT PDS II 0 18 (SUTURE) IMPLANT
GLOVE BIO SURGEON STRL SZ7.5 (GLOVE) ×2 IMPLANT
GOWN STRL REUS W/ TWL LRG LVL3 (GOWN DISPOSABLE) ×2 IMPLANT
GOWN STRL REUS W/ TWL XL LVL3 (GOWN DISPOSABLE) ×1 IMPLANT
IRRIGATION SUCT STRKRFLW 2 WTP (MISCELLANEOUS) IMPLANT
KIT BASIN OR (CUSTOM PROCEDURE TRAY) ×1 IMPLANT
KIT TURNOVER KIT B (KITS) ×1 IMPLANT
MESH 3DMAX 4X6 LT LRG (Mesh General) IMPLANT
MESH 3DMAX 4X6 RT LRG (Mesh General) IMPLANT
NDL INSUFFLATION 14GA 120MM (NEEDLE) IMPLANT
NEEDLE INSUFFLATION 14GA 120MM (NEEDLE) ×1 IMPLANT
NS IRRIG 1000ML POUR BTL (IV SOLUTION) ×1 IMPLANT
PAD ARMBOARD POSITIONER FOAM (MISCELLANEOUS) ×2 IMPLANT
RELOAD STAPLE 4.0 BLU F/HERNIA (INSTRUMENTS) IMPLANT
SCISSORS LAP 5X35 DISP (ENDOMECHANICALS) ×1 IMPLANT
SET TUBE SMOKE EVAC HIGH FLOW (TUBING) ×1 IMPLANT
STAPLER HERNIA 12 8.5 360D (INSTRUMENTS) IMPLANT
SUT MNCRL AB 4-0 PS2 18 (SUTURE) ×1 IMPLANT
SUT VIC AB 1 CT1 27XBRD ANBCTR (SUTURE) IMPLANT
SUT VICRYL 0 UR6 27IN ABS (SUTURE) IMPLANT
TOWEL GREEN STERILE (TOWEL DISPOSABLE) ×1 IMPLANT
TOWEL GREEN STERILE FF (TOWEL DISPOSABLE) ×1 IMPLANT
TRAY LAPAROSCOPIC MC (CUSTOM PROCEDURE TRAY) ×1 IMPLANT
TROCAR OPTICAL SHORT 5MM (TROCAR) ×1 IMPLANT
TROCAR OPTICAL SLV SHORT 5MM (TROCAR) ×1 IMPLANT
TROCAR Z THREAD OPTICAL 12X100 (TROCAR) ×1 IMPLANT
WARMER LAPAROSCOPE (MISCELLANEOUS) ×1 IMPLANT
WATER STERILE IRR 1000ML POUR (IV SOLUTION) ×1 IMPLANT

## 2023-12-03 NOTE — Interval H&P Note (Signed)
 History and Physical Interval Note:  12/03/2023 7:52 AM  Melanie Collins  has presented today for surgery, with the diagnosis of BILATERAL INGUINAL HERNIA.  The various methods of treatment have been discussed with the patient and family. After consideration of risks, benefits and other options for treatment, the patient has consented to  Procedure(s): REPAIR, HERNIA, INGUINAL, BILATERAL, LAPAROSCOPIC (Bilateral) as a surgical intervention.  The patient's history has been reviewed, patient examined, no change in status, stable for surgery.  I have reviewed the patient's chart and labs.  Questions were answered to the patient's satisfaction.     Raevin Wierenga

## 2023-12-03 NOTE — Progress Notes (Signed)
 Pre-op Interpreter's name is Amelie Polite. Phone Number [phone].

## 2023-12-03 NOTE — Discharge Instructions (Signed)

## 2023-12-03 NOTE — Anesthesia Procedure Notes (Signed)
 Procedure Name: Intubation Date/Time: 12/03/2023 8:45 AM  Performed by: Arvell Edsel HERO, CRNAPre-anesthesia Checklist: Patient identified, Emergency Drugs available, Suction available, Patient being monitored and Timeout performed Patient Re-evaluated:Patient Re-evaluated prior to induction Oxygen Delivery Method: Circle system utilized Preoxygenation: Pre-oxygenation with 100% oxygen Induction Type: IV induction Ventilation: Mask ventilation without difficulty Laryngoscope Size: Mac and 3 Grade View: Grade I Tube size: 6.5 mm Number of attempts: 1 Airway Equipment and Method: Stylet and Patient positioned with wedge pillow Placement Confirmation: ETT inserted through vocal cords under direct vision, positive ETCO2, CO2 detector and breath sounds checked- equal and bilateral Secured at: 21 cm Tube secured with: Tape

## 2023-12-03 NOTE — Anesthesia Postprocedure Evaluation (Signed)
 Anesthesia Post Note  Patient: Melanie Collins  Procedure(s) Performed: REPAIR, HERNIA, INGUINAL, BILATERAL, LAPAROSCOPIC (Bilateral: Groin)     Patient location during evaluation: PACU Anesthesia Type: General Level of consciousness: awake and alert, oriented and patient cooperative Pain management: pain level controlled Vital Signs Assessment: post-procedure vital signs reviewed and stable Respiratory status: spontaneous breathing, nonlabored ventilation and respiratory function stable Cardiovascular status: blood pressure returned to baseline and stable Postop Assessment: no apparent nausea or vomiting Anesthetic complications: no   No notable events documented.  Last Vitals:  Vitals:   12/03/23 1000 12/03/23 1015  BP: 128/78 (!) 142/69  Pulse: 68 64  Resp: (!) 23 15  Temp:  (!) 36.4 C  SpO2: 91% 96%    Last Pain:  Vitals:   12/03/23 0945  TempSrc:   PainSc: 3                  [name]

## 2023-12-03 NOTE — Transfer of Care (Signed)
 Immediate Anesthesia Transfer of Care Note  Patient: Melanie Collins  Procedure(s) Performed: REPAIR, HERNIA, INGUINAL, BILATERAL, LAPAROSCOPIC (Bilateral: Groin)  Patient Location: PACU  Anesthesia Type:General  Level of Consciousness: drowsy and patient cooperative  Airway & Oxygen Therapy: Patient Spontanous Breathing and Patient connected to face mask oxygen  Post-op Assessment: Report given to RN, Post -op Vital signs reviewed and stable, Patient moving all extremities, and Patient moving all extremities X 4  Post vital signs: Reviewed and stable  Last Vitals:  Vitals Value Taken Time  BP 125/64 12/03/23 09:25  Temp 36.4 C 12/03/23 09:25  Pulse 93 12/03/23 09:27  Resp 15 12/03/23 09:27  SpO2 100 % 12/03/23 09:27  Vitals shown include unfiled device data.  Last Pain:  Vitals:   12/03/23 0711  TempSrc:   PainSc: 0-No pain      Patients Stated Pain Goal: 0 (12/03/23 0657)  Complications: No notable events documented.

## 2023-12-03 NOTE — Op Note (Signed)
 12/03/2023  9:08 AM  PATIENT:  Melanie Collins Collins  63 y.o. female  PRE-OPERATIVE DIAGNOSIS:  BILATERAL INGUINAL HERNIA  POST-OPERATIVE DIAGNOSIS:  BILATERAL INGUINAL HERNIA, Left Direct and Right indirect  PROCEDURE:  Procedure(s): REPAIR, HERNIA, INGUINAL, BILATERAL, LAPAROSCOPIC (Bilateral)  SURGEON:  Surgeons and Role:    DEWAINE Rubin Calamity, MD - Primary  ASSISTANTS: Waddell Collier, RNFA   ANESTHESIA:   local and general  EBL:  minimal   BLOOD ADMINISTERED:none  DRAINS: none   LOCAL MEDICATIONS USED:  BUPIVICAINE   SPECIMEN:  No Specimen  DISPOSITION OF SPECIMEN:  N/A  COUNTS:  YES  TOURNIQUET:  * No tourniquets in log *  DICTATION: .Dragon Dictation    Counts: reported as correct x 2  Findings:  The patient had a small indirect right & left small direct hernias  Indications for procedure:  The patient is a 63 year old female with a bilateral hernias for several months. Patient complained of symptomatology to his bilateral inguinal areas. The patient was taken back for elective inguinal hernia repair.  Details of the procedure:   The patient was taken back to the operating room. The patient was placed in supine position with bilateral SCDs in place.  The patient underwent GETA.  The patient was prepped and draped in the usual sterile fashion.  After appropriate anitbiotics were confirmed, a time-out was confirmed and all facts were verified.  0.25% Marcaine  was used to infiltrate the umbilical area. A 11-blade was used to cut down the skin and blunt dissection was used to get the anterior fashion.  The anterior fascia was incised approximately 1 cm and the muscles were retracted laterally. Blunt dissection was then used to create a space in the preperitoneal area. At this time a 10 mm camera was then introduced into the space and advanced the pubic tubercle and a 12 mm trocar was placed over this and insufflation was started.   At this time and space was  created from medial to laterally the preperitoneal space on the right.  Cooper's ligament was initially cleaned off.  No direct hernia was seen.    The round ligament was identified and dissected away from the surrounding tissue.  This was circumferentially dissected and transected. The hernia was seen in the indirect space with a small cord lipoma. The peritoneum was dissected back to approximate level of the umbilicus.  I continued with dissection on the left side.  A space was created from medial to laterally the preperitoneal space on the right.  Cooper's ligament was initially cleaned off.  A small fat-containing direct hernia was seen.    This was dissected out and the transversalis fascia spontaneously retracted.  The round ligament was identified and dissected away from the surrounding tissue.  This was circumferentially dissected and transected.  There was no indirect hernia was seen in the indirect space. The peritoneum was dissected back to approximate level of the umbilicus.   A Bard 3D Max mesh, size: Large, the left and right side was  introduced into the preperitoneal space.  The mesh was brought over to cover the direct and indirect and femoral hernia spaces.  This was anchored into place and secured to Cooper's ligament with 4.67mm staples from a [place] hernia stapler. It was anchored to the anterior abdominal wall with 4.8 mm staples. The hernia sac was seen lying posterior to the mesh. There was no staples placed laterally.   The insufflation was evacuated and the peritoneum was seen posterior to the  mesh bilaterally. The trochars were removed. The anterior fascia was reapproximated using #1 Vicryl on a UR- 6.  Intra-abdominal air was evacuated and the Veress needle removed. The skin was reapproximated using 4-0 Monocryl subcuticular fashion and was dressed with Dermabond. The  patient was awakened from general anesthesia and taken to recovery in stable condition.   PLAN OF CARE:  Discharge to home after PACU  PATIENT DISPOSITION:  PACU - hemodynamically stable.   Delay start of Pharmacological VTE agent (>24hrs) due to surgical blood loss or risk of bleeding: not applicable

## 2023-12-03 NOTE — Anesthesia Preprocedure Evaluation (Addendum)
 Anesthesia Evaluation  Patient identified by MRN, date of birth, ID band Patient awake    Reviewed: Allergy & Precautions, H&P , NPO status , Patient's Chart, lab work & pertinent test results  Airway Mallampati: III  TM Distance: >3 FB Neck ROM: Full    Dental  (+) Teeth Intact, Dental Advisory Given   Pulmonary  Snores at night, has not had sleep study   Pulmonary exam normal breath sounds clear to auscultation       Cardiovascular hypertension (167/75 preop, normally SBPs 130s), Pt. on medications Normal cardiovascular exam Rhythm:Regular Rate:Normal     Neuro/Psych negative neurological ROS  negative psych ROS   GI/Hepatic negative GI ROS, Neg liver ROS,,,  Endo/Other  negative endocrine ROS    Renal/GU negative Renal ROS  negative genitourinary   Musculoskeletal negative musculoskeletal ROS (+)    Abdominal   Peds negative pediatric ROS (+)  Hematology negative hematology ROS (+)   Anesthesia Other Findings   Reproductive/Obstetrics negative OB ROS                              Anesthesia Physical Anesthesia Plan  ASA: 2  Anesthesia Plan: General   Post-op Pain Management: Tylenol  PO (pre-op)*   Induction: Intravenous  PONV Risk Score and Plan: 3 and Ondansetron , Dexamethasone , Midazolam  and Treatment may vary due to age or medical condition  Airway Management Planned: Oral ETT  Additional Equipment: None  Intra-op Plan:   Post-operative Plan: Extubation in OR  Informed Consent: I have reviewed the patients History and Physical, chart, labs and discussed the procedure including the risks, benefits and alternatives for the proposed anesthesia with the patient or authorized representative who has indicated his/her understanding and acceptance.     Dental advisory given  Plan Discussed with: CRNA  Anesthesia Plan Comments:          Anesthesia Quick  Evaluation

## 2023-12-04 ENCOUNTER — Encounter (HOSPITAL_COMMUNITY): Payer: Self-pay | Admitting: General Surgery
